# Patient Record
Sex: Female | Born: 1945 | Race: Black or African American | Hispanic: No | Smoking: Former smoker
Health system: Southern US, Community
[De-identification: ages and names within clinical notes are randomized; demographics above are authoritative.]

## PROBLEM LIST (undated history)

## (undated) DIAGNOSIS — K219 Gastro-esophageal reflux disease without esophagitis: Secondary | ICD-10-CM

## (undated) DIAGNOSIS — C569 Malignant neoplasm of unspecified ovary: Secondary | ICD-10-CM

## (undated) DIAGNOSIS — N189 Chronic kidney disease, unspecified: Secondary | ICD-10-CM

## (undated) DIAGNOSIS — R1310 Dysphagia, unspecified: Secondary | ICD-10-CM

## (undated) DIAGNOSIS — I2699 Other pulmonary embolism without acute cor pulmonale: Secondary | ICD-10-CM

## (undated) DIAGNOSIS — I639 Cerebral infarction, unspecified: Secondary | ICD-10-CM

## (undated) DIAGNOSIS — L89309 Pressure ulcer of unspecified buttock, unspecified stage: Secondary | ICD-10-CM

## (undated) DIAGNOSIS — E785 Hyperlipidemia, unspecified: Secondary | ICD-10-CM

## (undated) DIAGNOSIS — I219 Acute myocardial infarction, unspecified: Secondary | ICD-10-CM

## (undated) DIAGNOSIS — E1149 Type 2 diabetes mellitus with other diabetic neurological complication: Secondary | ICD-10-CM

## (undated) DIAGNOSIS — K635 Polyp of colon: Secondary | ICD-10-CM

## (undated) DIAGNOSIS — I509 Heart failure, unspecified: Secondary | ICD-10-CM

## (undated) DIAGNOSIS — F418 Other specified anxiety disorders: Secondary | ICD-10-CM

## (undated) DIAGNOSIS — I69359 Hemiplegia and hemiparesis following cerebral infarction affecting unspecified side: Principal | ICD-10-CM

## (undated) DIAGNOSIS — G609 Hereditary and idiopathic neuropathy, unspecified: Secondary | ICD-10-CM

## (undated) DIAGNOSIS — R609 Edema, unspecified: Secondary | ICD-10-CM

## (undated) DIAGNOSIS — D649 Anemia, unspecified: Secondary | ICD-10-CM

## (undated) DIAGNOSIS — N63 Unspecified lump in unspecified breast: Secondary | ICD-10-CM

## (undated) DIAGNOSIS — R600 Localized edema: Secondary | ICD-10-CM

## (undated) DIAGNOSIS — N259 Disorder resulting from impaired renal tubular function, unspecified: Secondary | ICD-10-CM

## (undated) DIAGNOSIS — I69398 Other sequelae of cerebral infarction: Principal | ICD-10-CM

## (undated) DIAGNOSIS — I428 Other cardiomyopathies: Secondary | ICD-10-CM

## (undated) DIAGNOSIS — I10 Essential (primary) hypertension: Principal | ICD-10-CM

## (undated) DIAGNOSIS — I059 Rheumatic mitral valve disease, unspecified: Secondary | ICD-10-CM

## (undated) DIAGNOSIS — K5790 Diverticulosis of intestine, part unspecified, without perforation or abscess without bleeding: Secondary | ICD-10-CM

## (undated) DIAGNOSIS — D509 Iron deficiency anemia, unspecified: Secondary | ICD-10-CM

## (undated) DIAGNOSIS — G8190 Hemiplegia, unspecified affecting unspecified side: Secondary | ICD-10-CM

## (undated) DIAGNOSIS — Z950 Presence of cardiac pacemaker: Secondary | ICD-10-CM

## (undated) DIAGNOSIS — G629 Polyneuropathy, unspecified: Secondary | ICD-10-CM

## (undated) DIAGNOSIS — I82409 Acute embolism and thrombosis of unspecified deep veins of unspecified lower extremity: Secondary | ICD-10-CM

## (undated) DIAGNOSIS — E119 Type 2 diabetes mellitus without complications: Secondary | ICD-10-CM

## (undated) DIAGNOSIS — E876 Hypokalemia: Secondary | ICD-10-CM

## (undated) HISTORY — DX: Essential (primary) hypertension: I10

## (undated) HISTORY — DX: Hemiplegia, unspecified affecting unspecified side: G81.90

## (undated) HISTORY — DX: Other sequelae of cerebral infarction: I69.398

## (undated) HISTORY — DX: Iron deficiency anemia, unspecified: D50.9

## (undated) HISTORY — PX: PACEMAKER INSERTION: SHX728

## (undated) HISTORY — DX: Unspecified lump in unspecified breast: N63.0

## (undated) HISTORY — DX: Polyneuropathy, unspecified: G62.9

## (undated) HISTORY — DX: Presence of cardiac pacemaker: Z95.0

## (undated) HISTORY — DX: Rheumatic mitral valve disease, unspecified: I05.9

## (undated) HISTORY — DX: Type 2 diabetes mellitus without complications: E11.9

## (undated) HISTORY — DX: Localized edema: R60.0

## (undated) HISTORY — DX: Hereditary and idiopathic neuropathy, unspecified: G60.9

## (undated) HISTORY — DX: Chronic kidney disease, unspecified: N18.9

## (undated) HISTORY — PX: CATARACT EXTRACTION: SUR2

## (undated) HISTORY — DX: Cerebral infarction, unspecified: I63.9

## (undated) HISTORY — DX: Other specified anxiety disorders: F41.8

## (undated) HISTORY — DX: Edema, unspecified: R60.9

## (undated) HISTORY — DX: Gastro-esophageal reflux disease without esophagitis: K21.9

## (undated) HISTORY — DX: Anemia, unspecified: D64.9

## (undated) HISTORY — DX: Dysphagia, unspecified: R13.10

## (undated) HISTORY — DX: Hemiplegia and hemiparesis following cerebral infarction affecting unspecified side: I69.359

## (undated) HISTORY — DX: Acute myocardial infarction, unspecified: I21.9

## (undated) HISTORY — DX: Malignant neoplasm of unspecified ovary: C56.9

## (undated) HISTORY — DX: Disorder resulting from impaired renal tubular function, unspecified: N25.9

## (undated) HISTORY — DX: Type 2 diabetes mellitus with other diabetic neurological complication: E11.49

## (undated) HISTORY — DX: Pressure ulcer of unspecified buttock, unspecified stage: L89.309

## (undated) HISTORY — DX: Hyperlipidemia, unspecified: E78.5

## (undated) HISTORY — DX: Other cardiomyopathies: I42.8

## (undated) HISTORY — DX: Diverticulosis of intestine, part unspecified, without perforation or abscess without bleeding: K57.90

## (undated) HISTORY — DX: Heart failure, unspecified: I50.9

## (undated) HISTORY — DX: Other pulmonary embolism without acute cor pulmonale: I26.99

## (undated) HISTORY — DX: Acute embolism and thrombosis of unspecified deep veins of unspecified lower extremity: I82.409

## (undated) HISTORY — PX: CORONARY ARTERY BYPASS GRAFT: SHX141

## (undated) HISTORY — DX: Polyp of colon: K63.5

## (undated) HISTORY — DX: Hypokalemia: E87.6

---

## 1997-08-17 ENCOUNTER — Encounter: Admission: RE | Admit: 1997-08-17 | Discharge: 1997-08-17 | Payer: Self-pay | Admitting: Internal Medicine

## 1997-09-24 ENCOUNTER — Encounter: Admission: RE | Admit: 1997-09-24 | Discharge: 1997-09-24 | Payer: Self-pay | Admitting: Internal Medicine

## 1998-01-21 ENCOUNTER — Encounter: Admission: RE | Admit: 1998-01-21 | Discharge: 1998-01-21 | Payer: Self-pay | Admitting: Internal Medicine

## 1998-02-21 ENCOUNTER — Ambulatory Visit (HOSPITAL_COMMUNITY): Admission: RE | Admit: 1998-02-21 | Discharge: 1998-02-21 | Payer: Self-pay | Admitting: Family Medicine

## 1998-06-27 ENCOUNTER — Encounter: Admission: RE | Admit: 1998-06-27 | Discharge: 1998-06-27 | Payer: Self-pay | Admitting: Internal Medicine

## 1998-11-28 ENCOUNTER — Encounter: Admission: RE | Admit: 1998-11-28 | Discharge: 1998-11-28 | Payer: Self-pay | Admitting: Internal Medicine

## 1998-12-26 ENCOUNTER — Encounter: Admission: RE | Admit: 1998-12-26 | Discharge: 1998-12-26 | Payer: Self-pay | Admitting: Internal Medicine

## 1999-03-13 ENCOUNTER — Encounter: Admission: RE | Admit: 1999-03-13 | Discharge: 1999-03-13 | Payer: Self-pay | Admitting: Hematology and Oncology

## 1999-05-04 ENCOUNTER — Ambulatory Visit (HOSPITAL_COMMUNITY): Admission: RE | Admit: 1999-05-04 | Discharge: 1999-05-04 | Payer: Self-pay | Admitting: *Deleted

## 1999-05-04 ENCOUNTER — Encounter: Payer: Self-pay | Admitting: *Deleted

## 1999-07-10 ENCOUNTER — Encounter: Admission: RE | Admit: 1999-07-10 | Discharge: 1999-07-10 | Payer: Self-pay | Admitting: Internal Medicine

## 1999-08-28 ENCOUNTER — Encounter: Admission: RE | Admit: 1999-08-28 | Discharge: 1999-08-28 | Payer: Self-pay | Admitting: Internal Medicine

## 2000-01-03 ENCOUNTER — Encounter: Admission: RE | Admit: 2000-01-03 | Discharge: 2000-01-03 | Payer: Self-pay | Admitting: Internal Medicine

## 2000-04-10 ENCOUNTER — Encounter: Admission: RE | Admit: 2000-04-10 | Discharge: 2000-04-10 | Payer: Self-pay | Admitting: Internal Medicine

## 2000-05-31 ENCOUNTER — Emergency Department (HOSPITAL_COMMUNITY): Admission: EM | Admit: 2000-05-31 | Discharge: 2000-05-31 | Payer: Self-pay | Admitting: Emergency Medicine

## 2000-06-18 ENCOUNTER — Emergency Department (HOSPITAL_COMMUNITY): Admission: EM | Admit: 2000-06-18 | Discharge: 2000-06-18 | Payer: Self-pay | Admitting: Emergency Medicine

## 2000-10-16 ENCOUNTER — Encounter: Admission: RE | Admit: 2000-10-16 | Discharge: 2000-10-16 | Payer: Self-pay | Admitting: Internal Medicine

## 2001-02-12 ENCOUNTER — Encounter: Admission: RE | Admit: 2001-02-12 | Discharge: 2001-02-12 | Payer: Self-pay | Admitting: Internal Medicine

## 2001-03-11 ENCOUNTER — Ambulatory Visit (HOSPITAL_COMMUNITY): Admission: RE | Admit: 2001-03-11 | Discharge: 2001-03-11 | Payer: Self-pay | Admitting: *Deleted

## 2001-03-18 ENCOUNTER — Encounter: Admission: RE | Admit: 2001-03-18 | Discharge: 2001-03-18 | Payer: Self-pay | Admitting: Obstetrics

## 2001-03-18 ENCOUNTER — Encounter: Payer: Self-pay | Admitting: Obstetrics

## 2001-06-25 ENCOUNTER — Encounter: Admission: RE | Admit: 2001-06-25 | Discharge: 2001-06-25 | Payer: Self-pay | Admitting: Internal Medicine

## 2001-07-08 ENCOUNTER — Encounter: Admission: RE | Admit: 2001-07-08 | Discharge: 2001-07-08 | Payer: Self-pay | Admitting: Internal Medicine

## 2001-07-09 ENCOUNTER — Ambulatory Visit (HOSPITAL_COMMUNITY): Admission: RE | Admit: 2001-07-09 | Discharge: 2001-07-09 | Payer: Self-pay | Admitting: *Deleted

## 2001-07-17 ENCOUNTER — Encounter: Payer: Self-pay | Admitting: Cardiology

## 2001-07-17 ENCOUNTER — Ambulatory Visit (HOSPITAL_COMMUNITY): Admission: RE | Admit: 2001-07-17 | Discharge: 2001-07-17 | Payer: Self-pay | Admitting: Cardiology

## 2001-10-07 ENCOUNTER — Encounter: Admission: RE | Admit: 2001-10-07 | Discharge: 2001-10-07 | Payer: Self-pay | Admitting: Internal Medicine

## 2002-01-06 ENCOUNTER — Encounter: Admission: RE | Admit: 2002-01-06 | Discharge: 2002-01-06 | Payer: Self-pay | Admitting: Internal Medicine

## 2002-02-16 ENCOUNTER — Encounter: Admission: RE | Admit: 2002-02-16 | Discharge: 2002-02-16 | Payer: Self-pay | Admitting: Internal Medicine

## 2002-04-14 ENCOUNTER — Encounter: Admission: RE | Admit: 2002-04-14 | Discharge: 2002-04-14 | Payer: Self-pay | Admitting: Internal Medicine

## 2002-06-09 ENCOUNTER — Encounter: Admission: RE | Admit: 2002-06-09 | Discharge: 2002-06-09 | Payer: Self-pay | Admitting: Internal Medicine

## 2003-02-02 ENCOUNTER — Encounter: Admission: RE | Admit: 2003-02-02 | Discharge: 2003-02-02 | Payer: Self-pay | Admitting: Internal Medicine

## 2003-02-16 ENCOUNTER — Encounter: Admission: RE | Admit: 2003-02-16 | Discharge: 2003-02-16 | Payer: Self-pay | Admitting: Internal Medicine

## 2003-04-20 ENCOUNTER — Encounter: Admission: RE | Admit: 2003-04-20 | Discharge: 2003-04-20 | Payer: Self-pay | Admitting: Internal Medicine

## 2003-05-05 ENCOUNTER — Ambulatory Visit (HOSPITAL_COMMUNITY): Admission: RE | Admit: 2003-05-05 | Discharge: 2003-05-05 | Payer: Self-pay | Admitting: Internal Medicine

## 2003-06-03 ENCOUNTER — Encounter: Admission: RE | Admit: 2003-06-03 | Discharge: 2003-06-03 | Payer: Self-pay | Admitting: Internal Medicine

## 2003-08-09 ENCOUNTER — Encounter: Admission: RE | Admit: 2003-08-09 | Discharge: 2003-08-09 | Payer: Self-pay | Admitting: Internal Medicine

## 2003-08-19 ENCOUNTER — Encounter: Admission: RE | Admit: 2003-08-19 | Discharge: 2003-08-19 | Payer: Self-pay | Admitting: Internal Medicine

## 2003-08-31 ENCOUNTER — Encounter: Admission: RE | Admit: 2003-08-31 | Discharge: 2003-11-29 | Payer: Self-pay | Admitting: Internal Medicine

## 2003-09-27 ENCOUNTER — Encounter: Admission: RE | Admit: 2003-09-27 | Discharge: 2003-09-27 | Payer: Self-pay | Admitting: Internal Medicine

## 2003-10-04 ENCOUNTER — Encounter: Admission: RE | Admit: 2003-10-04 | Discharge: 2003-10-04 | Payer: Self-pay | Admitting: Internal Medicine

## 2003-12-04 ENCOUNTER — Inpatient Hospital Stay (HOSPITAL_COMMUNITY): Admission: EM | Admit: 2003-12-04 | Discharge: 2003-12-06 | Payer: Self-pay | Admitting: Emergency Medicine

## 2004-01-05 ENCOUNTER — Encounter
Admission: RE | Admit: 2004-01-05 | Discharge: 2004-01-05 | Payer: Self-pay | Admitting: Thoracic Surgery (Cardiothoracic Vascular Surgery)

## 2004-01-05 ENCOUNTER — Ambulatory Visit (HOSPITAL_COMMUNITY)
Admission: RE | Admit: 2004-01-05 | Discharge: 2004-01-05 | Payer: Self-pay | Admitting: Thoracic Surgery (Cardiothoracic Vascular Surgery)

## 2004-01-05 ENCOUNTER — Encounter: Admission: RE | Admit: 2004-01-05 | Discharge: 2004-01-05 | Payer: Self-pay | Admitting: Internal Medicine

## 2004-02-03 ENCOUNTER — Ambulatory Visit (HOSPITAL_COMMUNITY): Admission: RE | Admit: 2004-02-03 | Discharge: 2004-02-03 | Payer: Self-pay | Admitting: Cardiology

## 2004-02-03 ENCOUNTER — Encounter (INDEPENDENT_AMBULATORY_CARE_PROVIDER_SITE_OTHER): Payer: Self-pay | Admitting: Cardiology

## 2004-03-20 ENCOUNTER — Ambulatory Visit: Payer: Self-pay | Admitting: Internal Medicine

## 2004-03-21 ENCOUNTER — Inpatient Hospital Stay (HOSPITAL_COMMUNITY)
Admission: RE | Admit: 2004-03-21 | Discharge: 2004-04-04 | Payer: Self-pay | Admitting: Thoracic Surgery (Cardiothoracic Vascular Surgery)

## 2004-04-24 ENCOUNTER — Encounter
Admission: RE | Admit: 2004-04-24 | Discharge: 2004-04-24 | Payer: Self-pay | Admitting: Thoracic Surgery (Cardiothoracic Vascular Surgery)

## 2004-05-31 ENCOUNTER — Ambulatory Visit: Payer: Self-pay | Admitting: Internal Medicine

## 2004-06-12 ENCOUNTER — Encounter (HOSPITAL_COMMUNITY): Admission: RE | Admit: 2004-06-12 | Discharge: 2004-09-10 | Payer: Self-pay | Admitting: Cardiology

## 2004-09-11 ENCOUNTER — Encounter (HOSPITAL_COMMUNITY): Admission: RE | Admit: 2004-09-11 | Discharge: 2004-10-09 | Payer: Self-pay | Admitting: Cardiology

## 2004-09-11 ENCOUNTER — Ambulatory Visit: Payer: Self-pay | Admitting: Internal Medicine

## 2004-09-25 ENCOUNTER — Ambulatory Visit: Payer: Self-pay | Admitting: Internal Medicine

## 2004-10-05 ENCOUNTER — Ambulatory Visit: Payer: Self-pay | Admitting: Internal Medicine

## 2004-10-10 ENCOUNTER — Ambulatory Visit: Payer: Self-pay | Admitting: Internal Medicine

## 2004-10-10 ENCOUNTER — Encounter (HOSPITAL_COMMUNITY): Admission: RE | Admit: 2004-10-10 | Discharge: 2005-01-08 | Payer: Self-pay | Admitting: Cardiology

## 2005-01-11 ENCOUNTER — Ambulatory Visit: Payer: Self-pay | Admitting: Internal Medicine

## 2005-01-12 ENCOUNTER — Ambulatory Visit: Payer: Self-pay | Admitting: Internal Medicine

## 2005-01-23 ENCOUNTER — Ambulatory Visit (HOSPITAL_COMMUNITY): Admission: RE | Admit: 2005-01-23 | Discharge: 2005-01-23 | Payer: Self-pay | Admitting: Internal Medicine

## 2005-01-26 ENCOUNTER — Ambulatory Visit: Payer: Self-pay | Admitting: Internal Medicine

## 2005-04-03 ENCOUNTER — Ambulatory Visit: Payer: Self-pay | Admitting: Internal Medicine

## 2005-04-26 ENCOUNTER — Ambulatory Visit: Payer: Self-pay | Admitting: Internal Medicine

## 2005-06-07 ENCOUNTER — Ambulatory Visit: Payer: Self-pay | Admitting: Internal Medicine

## 2005-06-11 ENCOUNTER — Ambulatory Visit: Payer: Self-pay | Admitting: Internal Medicine

## 2005-06-21 ENCOUNTER — Ambulatory Visit (HOSPITAL_COMMUNITY): Admission: RE | Admit: 2005-06-21 | Discharge: 2005-06-21 | Payer: Self-pay | Admitting: Internal Medicine

## 2005-06-28 ENCOUNTER — Encounter (HOSPITAL_COMMUNITY): Admission: RE | Admit: 2005-06-28 | Discharge: 2005-09-26 | Payer: Self-pay | Admitting: Cardiology

## 2005-08-27 ENCOUNTER — Ambulatory Visit: Payer: Self-pay | Admitting: Hospitalist

## 2005-09-11 ENCOUNTER — Ambulatory Visit: Payer: Self-pay | Admitting: Internal Medicine

## 2005-09-27 ENCOUNTER — Encounter (HOSPITAL_COMMUNITY): Admission: RE | Admit: 2005-09-27 | Discharge: 2005-12-26 | Payer: Self-pay | Admitting: Cardiology

## 2005-10-23 ENCOUNTER — Ambulatory Visit: Payer: Self-pay | Admitting: Internal Medicine

## 2005-10-30 ENCOUNTER — Ambulatory Visit: Payer: Self-pay | Admitting: Internal Medicine

## 2006-01-14 ENCOUNTER — Ambulatory Visit: Payer: Self-pay | Admitting: Internal Medicine

## 2006-02-05 ENCOUNTER — Ambulatory Visit: Payer: Self-pay | Admitting: Internal Medicine

## 2006-02-14 DIAGNOSIS — I509 Heart failure, unspecified: Secondary | ICD-10-CM

## 2006-02-14 DIAGNOSIS — I10 Essential (primary) hypertension: Secondary | ICD-10-CM

## 2006-02-14 DIAGNOSIS — E119 Type 2 diabetes mellitus without complications: Secondary | ICD-10-CM

## 2006-02-14 DIAGNOSIS — I5032 Chronic diastolic (congestive) heart failure: Secondary | ICD-10-CM | POA: Insufficient documentation

## 2006-02-14 HISTORY — DX: Essential (primary) hypertension: I10

## 2006-02-14 HISTORY — DX: Heart failure, unspecified: I50.9

## 2006-05-23 DIAGNOSIS — E1122 Type 2 diabetes mellitus with diabetic chronic kidney disease: Secondary | ICD-10-CM

## 2006-05-23 DIAGNOSIS — I059 Rheumatic mitral valve disease, unspecified: Secondary | ICD-10-CM | POA: Insufficient documentation

## 2006-05-23 DIAGNOSIS — N183 Chronic kidney disease, stage 3 (moderate): Secondary | ICD-10-CM

## 2006-05-23 DIAGNOSIS — K5900 Constipation, unspecified: Secondary | ICD-10-CM | POA: Insufficient documentation

## 2006-05-23 DIAGNOSIS — Z950 Presence of cardiac pacemaker: Secondary | ICD-10-CM

## 2006-05-23 DIAGNOSIS — I428 Other cardiomyopathies: Secondary | ICD-10-CM | POA: Insufficient documentation

## 2006-05-23 DIAGNOSIS — I129 Hypertensive chronic kidney disease with stage 1 through stage 4 chronic kidney disease, or unspecified chronic kidney disease: Secondary | ICD-10-CM

## 2006-05-23 DIAGNOSIS — E785 Hyperlipidemia, unspecified: Secondary | ICD-10-CM

## 2006-05-23 DIAGNOSIS — N259 Disorder resulting from impaired renal tubular function, unspecified: Secondary | ICD-10-CM

## 2006-05-23 DIAGNOSIS — F411 Generalized anxiety disorder: Secondary | ICD-10-CM

## 2006-05-23 HISTORY — DX: Disorder resulting from impaired renal tubular function, unspecified: N25.9

## 2006-05-23 HISTORY — DX: Presence of cardiac pacemaker: Z95.0

## 2006-05-23 HISTORY — DX: Other cardiomyopathies: I42.8

## 2006-05-23 HISTORY — DX: Rheumatic mitral valve disease, unspecified: I05.9

## 2006-07-04 ENCOUNTER — Encounter (INDEPENDENT_AMBULATORY_CARE_PROVIDER_SITE_OTHER): Payer: Self-pay | Admitting: Unknown Physician Specialty

## 2006-07-04 ENCOUNTER — Ambulatory Visit: Payer: Self-pay | Admitting: Internal Medicine

## 2006-07-12 ENCOUNTER — Ambulatory Visit: Payer: Self-pay | Admitting: Internal Medicine

## 2006-07-12 ENCOUNTER — Encounter (INDEPENDENT_AMBULATORY_CARE_PROVIDER_SITE_OTHER): Payer: Self-pay | Admitting: Internal Medicine

## 2006-07-12 LAB — CONVERTED CEMR LAB: Blood Glucose, Fingerstick: 247

## 2006-07-19 LAB — CONVERTED CEMR LAB
CO2: 26 meq/L (ref 19–32)
Chloride: 102 meq/L (ref 96–112)
Glucose, Bld: 203 mg/dL — ABNORMAL HIGH (ref 70–99)
Sodium: 141 meq/L (ref 135–145)

## 2006-11-05 ENCOUNTER — Telehealth: Payer: Self-pay | Admitting: *Deleted

## 2006-11-12 ENCOUNTER — Emergency Department (HOSPITAL_COMMUNITY): Admission: EM | Admit: 2006-11-12 | Discharge: 2006-11-12 | Payer: Self-pay | Admitting: Emergency Medicine

## 2006-11-18 ENCOUNTER — Ambulatory Visit: Payer: Self-pay | Admitting: Hospitalist

## 2006-11-18 ENCOUNTER — Encounter: Payer: Self-pay | Admitting: Internal Medicine

## 2006-11-18 DIAGNOSIS — G609 Hereditary and idiopathic neuropathy, unspecified: Secondary | ICD-10-CM

## 2006-11-18 HISTORY — DX: Hereditary and idiopathic neuropathy, unspecified: G60.9

## 2006-11-18 LAB — CONVERTED CEMR LAB
Albumin: 4 g/dL (ref 3.5–5.2)
CO2: 27 meq/L (ref 19–32)
Calcium: 9.4 mg/dL (ref 8.4–10.5)
Chloride: 103 meq/L (ref 96–112)
Free T4: 1.26 ng/dL (ref 0.89–1.80)
Glucose, Bld: 144 mg/dL — ABNORMAL HIGH (ref 70–99)
Potassium: 4.6 meq/L (ref 3.5–5.3)
Sodium: 144 meq/L (ref 135–145)
TSH: 0.359 microintl units/mL (ref 0.350–5.50)
Total Protein: 6.7 g/dL (ref 6.0–8.3)

## 2006-11-21 ENCOUNTER — Encounter (INDEPENDENT_AMBULATORY_CARE_PROVIDER_SITE_OTHER): Payer: Self-pay | Admitting: Internal Medicine

## 2006-11-26 ENCOUNTER — Ambulatory Visit: Payer: Self-pay | Admitting: Internal Medicine

## 2006-11-26 ENCOUNTER — Encounter: Payer: Self-pay | Admitting: Internal Medicine

## 2006-11-26 DIAGNOSIS — E876 Hypokalemia: Secondary | ICD-10-CM

## 2006-11-26 HISTORY — DX: Hypokalemia: E87.6

## 2006-11-26 LAB — CONVERTED CEMR LAB
BUN: 9 mg/dL (ref 6–23)
CO2: 30 meq/L (ref 19–32)
Calcium: 9.3 mg/dL (ref 8.4–10.5)
Creatinine, Ser: 1.4 mg/dL — ABNORMAL HIGH (ref 0.40–1.20)
Glucose, Bld: 149 mg/dL — ABNORMAL HIGH (ref 70–99)
Sodium: 139 meq/L (ref 135–145)

## 2006-12-13 ENCOUNTER — Telehealth (INDEPENDENT_AMBULATORY_CARE_PROVIDER_SITE_OTHER): Payer: Self-pay | Admitting: Pharmacy Technician

## 2006-12-16 ENCOUNTER — Encounter (INDEPENDENT_AMBULATORY_CARE_PROVIDER_SITE_OTHER): Payer: Self-pay | Admitting: Internal Medicine

## 2006-12-16 ENCOUNTER — Ambulatory Visit: Payer: Self-pay | Admitting: *Deleted

## 2006-12-17 LAB — CONVERTED CEMR LAB
CO2: 28 meq/L (ref 19–32)
Calcium: 9.1 mg/dL (ref 8.4–10.5)
Creatinine, Ser: 1.29 mg/dL — ABNORMAL HIGH (ref 0.40–1.20)

## 2006-12-20 ENCOUNTER — Encounter
Admission: RE | Admit: 2006-12-20 | Discharge: 2007-02-04 | Payer: Self-pay | Admitting: Physical Medicine & Rehabilitation

## 2006-12-20 ENCOUNTER — Ambulatory Visit: Payer: Self-pay | Admitting: Physical Medicine & Rehabilitation

## 2006-12-23 ENCOUNTER — Encounter (INDEPENDENT_AMBULATORY_CARE_PROVIDER_SITE_OTHER): Payer: Self-pay | Admitting: Internal Medicine

## 2006-12-27 ENCOUNTER — Ambulatory Visit (HOSPITAL_COMMUNITY): Admission: RE | Admit: 2006-12-27 | Discharge: 2006-12-27 | Payer: Self-pay | Admitting: Obstetrics and Gynecology

## 2007-01-03 ENCOUNTER — Telehealth (INDEPENDENT_AMBULATORY_CARE_PROVIDER_SITE_OTHER): Payer: Self-pay | Admitting: *Deleted

## 2007-01-09 ENCOUNTER — Telehealth: Payer: Self-pay | Admitting: *Deleted

## 2007-04-08 ENCOUNTER — Ambulatory Visit: Payer: Self-pay | Admitting: Internal Medicine

## 2007-04-08 ENCOUNTER — Encounter (INDEPENDENT_AMBULATORY_CARE_PROVIDER_SITE_OTHER): Payer: Self-pay | Admitting: Internal Medicine

## 2007-04-08 LAB — CONVERTED CEMR LAB: Hgb A1c MFr Bld: 8.1 %

## 2007-04-22 LAB — CONVERTED CEMR LAB
AST: 9 units/L (ref 0–37)
Alkaline Phosphatase: 92 units/L (ref 39–117)
BUN: 14 mg/dL (ref 6–23)
Basophils Relative: 0 % (ref 0–1)
Creatinine, Ser: 1.57 mg/dL — ABNORMAL HIGH (ref 0.40–1.20)
Eosinophils Absolute: 0.2 10*3/uL (ref 0.2–0.7)
MCHC: 31.6 g/dL (ref 30.0–36.0)
MCV: 92.9 fL (ref 78.0–100.0)
Microalb, Ur: 59.6 mg/dL — ABNORMAL HIGH (ref 0.00–1.89)
Monocytes Absolute: 0.6 10*3/uL (ref 0.1–1.0)
Monocytes Relative: 5 % (ref 3–12)
Neutrophils Relative %: 69 % (ref 43–77)
RBC: 4.77 M/uL (ref 3.87–5.11)

## 2007-05-07 ENCOUNTER — Telehealth: Payer: Self-pay | Admitting: *Deleted

## 2007-05-16 ENCOUNTER — Ambulatory Visit: Payer: Self-pay | Admitting: Infectious Disease

## 2007-05-21 ENCOUNTER — Telehealth (INDEPENDENT_AMBULATORY_CARE_PROVIDER_SITE_OTHER): Payer: Self-pay | Admitting: *Deleted

## 2007-06-06 ENCOUNTER — Encounter (INDEPENDENT_AMBULATORY_CARE_PROVIDER_SITE_OTHER): Payer: Self-pay | Admitting: Internal Medicine

## 2007-06-06 ENCOUNTER — Ambulatory Visit: Payer: Self-pay | Admitting: Internal Medicine

## 2007-06-09 ENCOUNTER — Telehealth (INDEPENDENT_AMBULATORY_CARE_PROVIDER_SITE_OTHER): Payer: Self-pay | Admitting: Internal Medicine

## 2007-06-09 LAB — CONVERTED CEMR LAB
HDL: 29 mg/dL — ABNORMAL LOW (ref >39)
LDL Cholesterol: 73 mg/dL (ref 0–99)
Triglycerides: 344 mg/dL — ABNORMAL HIGH (ref <150)
VLDL: 69 mg/dL — ABNORMAL HIGH (ref 0–40)

## 2007-06-16 ENCOUNTER — Telehealth (INDEPENDENT_AMBULATORY_CARE_PROVIDER_SITE_OTHER): Payer: Self-pay | Admitting: Internal Medicine

## 2007-07-09 ENCOUNTER — Encounter (INDEPENDENT_AMBULATORY_CARE_PROVIDER_SITE_OTHER): Payer: Self-pay | Admitting: Internal Medicine

## 2007-12-09 ENCOUNTER — Encounter: Payer: Self-pay | Admitting: Licensed Clinical Social Worker

## 2008-01-23 ENCOUNTER — Encounter (INDEPENDENT_AMBULATORY_CARE_PROVIDER_SITE_OTHER): Payer: Self-pay | Admitting: Internal Medicine

## 2008-02-20 ENCOUNTER — Ambulatory Visit (HOSPITAL_COMMUNITY): Admission: RE | Admit: 2008-02-20 | Discharge: 2008-02-20 | Payer: Self-pay | Admitting: Internal Medicine

## 2008-04-05 ENCOUNTER — Ambulatory Visit: Payer: Self-pay | Admitting: Internal Medicine

## 2008-04-05 LAB — CONVERTED CEMR LAB
Blood Glucose, AC Bkfst: 290 mg/dL
Hgb A1c MFr Bld: 10.4 %

## 2008-04-11 LAB — CONVERTED CEMR LAB
AST: 11 units/L (ref 0–37)
BUN: 20 mg/dL (ref 6–23)
Basophils Relative: 0 % (ref 0–1)
Calcium: 9.1 mg/dL (ref 8.4–10.5)
Chloride: 106 meq/L (ref 96–112)
Creatinine, Ser: 1.56 mg/dL — ABNORMAL HIGH (ref 0.40–1.20)
Eosinophils Absolute: 0.3 10*3/uL (ref 0.0–0.7)
Glucose, Bld: 288 mg/dL — ABNORMAL HIGH (ref 70–99)
HDL: 33 mg/dL — ABNORMAL LOW (ref >39)
Hemoglobin: 13.1 g/dL (ref 12.0–15.0)
MCHC: 30.7 g/dL (ref 30.0–36.0)
MCV: 92 fL (ref 78.0–100.0)
Monocytes Absolute: 0.6 10*3/uL (ref 0.1–1.0)
Monocytes Relative: 5 % (ref 3–12)
Neutro Abs: 7.9 10*3/uL — ABNORMAL HIGH (ref 1.7–7.7)
RBC: 4.64 M/uL (ref 3.87–5.11)
TSH: 0.887 microintl units/mL (ref 0.350–4.50)
Total CHOL/HDL Ratio: 5.7
Triglycerides: 215 mg/dL — ABNORMAL HIGH (ref <150)

## 2008-04-19 ENCOUNTER — Ambulatory Visit: Payer: Self-pay | Admitting: Internal Medicine

## 2008-04-19 ENCOUNTER — Encounter: Payer: Self-pay | Admitting: Licensed Clinical Social Worker

## 2008-04-19 DIAGNOSIS — F17200 Nicotine dependence, unspecified, uncomplicated: Secondary | ICD-10-CM

## 2008-05-04 ENCOUNTER — Ambulatory Visit: Payer: Self-pay | Admitting: Internal Medicine

## 2008-05-04 ENCOUNTER — Encounter (INDEPENDENT_AMBULATORY_CARE_PROVIDER_SITE_OTHER): Payer: Self-pay | Admitting: Internal Medicine

## 2008-05-07 DIAGNOSIS — I639 Cerebral infarction, unspecified: Secondary | ICD-10-CM

## 2008-05-07 HISTORY — DX: Cerebral infarction, unspecified: I63.9

## 2008-05-20 ENCOUNTER — Encounter: Payer: Self-pay | Admitting: Internal Medicine

## 2008-05-20 ENCOUNTER — Ambulatory Visit: Payer: Self-pay | Admitting: Internal Medicine

## 2008-05-25 ENCOUNTER — Encounter: Payer: Self-pay | Admitting: Internal Medicine

## 2008-06-03 ENCOUNTER — Ambulatory Visit: Payer: Self-pay | Admitting: Internal Medicine

## 2008-07-05 ENCOUNTER — Encounter: Payer: Self-pay | Admitting: Internal Medicine

## 2008-07-05 ENCOUNTER — Ambulatory Visit: Payer: Self-pay | Admitting: Internal Medicine

## 2008-07-06 DIAGNOSIS — D72829 Elevated white blood cell count, unspecified: Secondary | ICD-10-CM | POA: Insufficient documentation

## 2008-07-06 LAB — CONVERTED CEMR LAB
Albumin: 3.9 g/dL (ref 3.5–5.2)
BUN: 15 mg/dL (ref 6–23)
Band Neutrophils: 0 % (ref 0–10)
CO2: 22 meq/L (ref 19–32)
Calcium: 9.4 mg/dL (ref 8.4–10.5)
Chloride: 108 meq/L (ref 96–112)
Creatinine, Ser: 1.44 mg/dL — ABNORMAL HIGH (ref 0.40–1.20)
Eosinophils Absolute: 0.3 10*3/uL (ref 0.0–0.7)
Eosinophils Relative: 3 % (ref 0–5)
HCT: 39.3 % (ref 36.0–46.0)
Hemoglobin: 12.3 g/dL (ref 12.0–15.0)
Lymphocytes Relative: 23 % (ref 12–46)
Lymphs Abs: 2.6 10*3/uL (ref 0.7–4.0)
MCHC: 31.3 g/dL (ref 30.0–36.0)
MCV: 89.9 fL (ref 78.0–100.0)
Monocytes Absolute: 0.7 10*3/uL (ref 0.1–1.0)
Monocytes Relative: 6 % (ref 3–12)
Potassium: 4.7 meq/L (ref 3.5–5.3)
RBC: 4.37 M/uL (ref 3.87–5.11)
TSH: 0.297 microintl units/mL — ABNORMAL LOW (ref 0.350–4.50)

## 2008-07-28 ENCOUNTER — Ambulatory Visit: Payer: Self-pay | Admitting: *Deleted

## 2008-07-28 LAB — CONVERTED CEMR LAB: Blood Glucose, Home Monitor: 4 mg/dL

## 2008-09-16 ENCOUNTER — Encounter (INDEPENDENT_AMBULATORY_CARE_PROVIDER_SITE_OTHER): Payer: Self-pay | Admitting: Internal Medicine

## 2008-11-06 ENCOUNTER — Ambulatory Visit: Payer: Self-pay | Admitting: Internal Medicine

## 2008-11-06 ENCOUNTER — Inpatient Hospital Stay (HOSPITAL_COMMUNITY): Admission: EM | Admit: 2008-11-06 | Discharge: 2008-11-12 | Payer: Self-pay | Admitting: Emergency Medicine

## 2008-11-06 ENCOUNTER — Encounter: Payer: Self-pay | Admitting: Internal Medicine

## 2008-11-08 ENCOUNTER — Encounter: Payer: Self-pay | Admitting: *Deleted

## 2008-11-08 LAB — CONVERTED CEMR LAB
Cholesterol: 178 mg/dL
LDL Cholesterol: 117 mg/dL
Triglycerides: 161 mg/dL

## 2008-11-09 ENCOUNTER — Encounter (INDEPENDENT_AMBULATORY_CARE_PROVIDER_SITE_OTHER): Payer: Self-pay | Admitting: Internal Medicine

## 2008-11-10 ENCOUNTER — Ambulatory Visit: Payer: Self-pay | Admitting: Physical Medicine & Rehabilitation

## 2008-11-12 ENCOUNTER — Encounter (INDEPENDENT_AMBULATORY_CARE_PROVIDER_SITE_OTHER): Payer: Self-pay | Admitting: Internal Medicine

## 2008-11-12 ENCOUNTER — Inpatient Hospital Stay (HOSPITAL_COMMUNITY)
Admission: RE | Admit: 2008-11-12 | Discharge: 2008-12-15 | Payer: Self-pay | Admitting: Physical Medicine & Rehabilitation

## 2008-11-12 DIAGNOSIS — I69959 Hemiplegia and hemiparesis following unspecified cerebrovascular disease affecting unspecified side: Secondary | ICD-10-CM | POA: Insufficient documentation

## 2008-11-13 ENCOUNTER — Ambulatory Visit: Payer: Self-pay | Admitting: Physical Medicine & Rehabilitation

## 2008-12-14 ENCOUNTER — Ambulatory Visit: Payer: Self-pay | Admitting: Physical Medicine & Rehabilitation

## 2008-12-24 ENCOUNTER — Encounter (INDEPENDENT_AMBULATORY_CARE_PROVIDER_SITE_OTHER): Payer: Self-pay | Admitting: Internal Medicine

## 2008-12-24 ENCOUNTER — Ambulatory Visit: Payer: Self-pay | Admitting: Infectious Disease

## 2008-12-24 ENCOUNTER — Inpatient Hospital Stay (HOSPITAL_COMMUNITY): Admission: EM | Admit: 2008-12-24 | Discharge: 2008-12-30 | Payer: Self-pay | Admitting: Emergency Medicine

## 2008-12-30 ENCOUNTER — Encounter (INDEPENDENT_AMBULATORY_CARE_PROVIDER_SITE_OTHER): Payer: Self-pay | Admitting: Internal Medicine

## 2008-12-31 ENCOUNTER — Ambulatory Visit: Payer: Self-pay | Admitting: Internal Medicine

## 2008-12-31 ENCOUNTER — Encounter (INDEPENDENT_AMBULATORY_CARE_PROVIDER_SITE_OTHER): Payer: Self-pay | Admitting: Internal Medicine

## 2008-12-31 DIAGNOSIS — I82409 Acute embolism and thrombosis of unspecified deep veins of unspecified lower extremity: Secondary | ICD-10-CM

## 2008-12-31 DIAGNOSIS — I2699 Other pulmonary embolism without acute cor pulmonale: Secondary | ICD-10-CM

## 2008-12-31 DIAGNOSIS — N390 Urinary tract infection, site not specified: Secondary | ICD-10-CM

## 2008-12-31 HISTORY — DX: Other pulmonary embolism without acute cor pulmonale: I26.99

## 2008-12-31 HISTORY — DX: Acute embolism and thrombosis of unspecified deep veins of unspecified lower extremity: I82.409

## 2008-12-31 LAB — CONVERTED CEMR LAB
CO2: 27 meq/L (ref 19–32)
Calcium: 9.3 mg/dL (ref 8.4–10.5)
Glucose, Bld: 159 mg/dL — ABNORMAL HIGH (ref 70–99)
Potassium: 3.9 meq/L (ref 3.5–5.3)
Sodium: 143 meq/L (ref 135–145)

## 2009-01-05 ENCOUNTER — Ambulatory Visit: Payer: Self-pay | Admitting: Infectious Disease

## 2009-01-06 ENCOUNTER — Telehealth (INDEPENDENT_AMBULATORY_CARE_PROVIDER_SITE_OTHER): Payer: Self-pay | Admitting: Pharmacist

## 2009-01-06 ENCOUNTER — Encounter: Payer: Self-pay | Admitting: *Deleted

## 2009-01-17 ENCOUNTER — Telehealth (INDEPENDENT_AMBULATORY_CARE_PROVIDER_SITE_OTHER): Payer: Self-pay | Admitting: Pharmacist

## 2009-01-18 ENCOUNTER — Other Ambulatory Visit: Payer: Self-pay | Admitting: Emergency Medicine

## 2009-01-18 ENCOUNTER — Ambulatory Visit: Payer: Self-pay | Admitting: Internal Medicine

## 2009-01-18 ENCOUNTER — Inpatient Hospital Stay (HOSPITAL_COMMUNITY): Admission: EM | Admit: 2009-01-18 | Discharge: 2009-01-25 | Payer: Self-pay | Admitting: Internal Medicine

## 2009-01-18 ENCOUNTER — Encounter: Payer: Self-pay | Admitting: Internal Medicine

## 2009-01-24 ENCOUNTER — Other Ambulatory Visit: Payer: Self-pay | Admitting: Physical Medicine & Rehabilitation

## 2009-01-25 ENCOUNTER — Encounter (INDEPENDENT_AMBULATORY_CARE_PROVIDER_SITE_OTHER): Payer: Self-pay | Admitting: Internal Medicine

## 2009-01-25 ENCOUNTER — Other Ambulatory Visit: Payer: Self-pay | Admitting: Physical Medicine & Rehabilitation

## 2009-01-26 ENCOUNTER — Telehealth: Payer: Self-pay | Admitting: *Deleted

## 2009-01-28 ENCOUNTER — Ambulatory Visit: Payer: Self-pay | Admitting: Infectious Diseases

## 2009-01-28 LAB — CONVERTED CEMR LAB

## 2009-02-09 ENCOUNTER — Encounter (INDEPENDENT_AMBULATORY_CARE_PROVIDER_SITE_OTHER): Payer: Self-pay | Admitting: Internal Medicine

## 2009-02-14 ENCOUNTER — Ambulatory Visit: Payer: Self-pay | Admitting: Internal Medicine

## 2009-02-14 DIAGNOSIS — L89309 Pressure ulcer of unspecified buttock, unspecified stage: Secondary | ICD-10-CM | POA: Insufficient documentation

## 2009-02-14 HISTORY — DX: Pressure ulcer of unspecified buttock, unspecified stage: L89.309

## 2009-02-14 LAB — CONVERTED CEMR LAB
Blood Glucose, Fingerstick: 130
Calcium: 9.5 mg/dL (ref 8.4–10.5)
Glucose, Bld: 134 mg/dL — ABNORMAL HIGH (ref 70–99)
Potassium: 4.4 meq/L (ref 3.5–5.3)
Sodium: 141 meq/L (ref 135–145)

## 2009-02-15 ENCOUNTER — Telehealth: Payer: Self-pay | Admitting: *Deleted

## 2009-02-22 ENCOUNTER — Inpatient Hospital Stay (HOSPITAL_COMMUNITY): Admission: EM | Admit: 2009-02-22 | Discharge: 2009-02-26 | Payer: Self-pay | Admitting: Emergency Medicine

## 2009-02-22 ENCOUNTER — Encounter (INDEPENDENT_AMBULATORY_CARE_PROVIDER_SITE_OTHER): Payer: Self-pay | Admitting: Dermatology

## 2009-02-22 ENCOUNTER — Ambulatory Visit: Payer: Self-pay | Admitting: Infectious Diseases

## 2009-02-25 ENCOUNTER — Encounter: Payer: Self-pay | Admitting: Internal Medicine

## 2009-03-14 ENCOUNTER — Ambulatory Visit: Payer: Self-pay | Admitting: Internal Medicine

## 2009-03-15 LAB — CONVERTED CEMR LAB
BUN: 5 mg/dL — ABNORMAL LOW (ref 6–23)
Chloride: 102 meq/L (ref 96–112)
Potassium: 4.3 meq/L (ref 3.5–5.3)
Prothrombin Time: 13.7 s (ref 11.6–15.2)
Sodium: 138 meq/L (ref 135–145)

## 2010-01-31 IMAGING — CT CT HEAD W/O CM
1 of 2 series · 13 of 30 positions shown, 17 images · non-contrast
Comparison: 11/07/2008

CLINICAL DATA: The hemiparesis.  Worsening left side weakness.

CT HEAD WITHOUT CONTRAST
TECHNIQUE: Contiguous axial images were obtained from the base of
the skull through the vertex without contrast.

[Series 2: brain · axial · 0.47mm/px · z∈[+138,+263]mm · 13 of 28 slices shown, 17 images]
[im 2/28  brain]
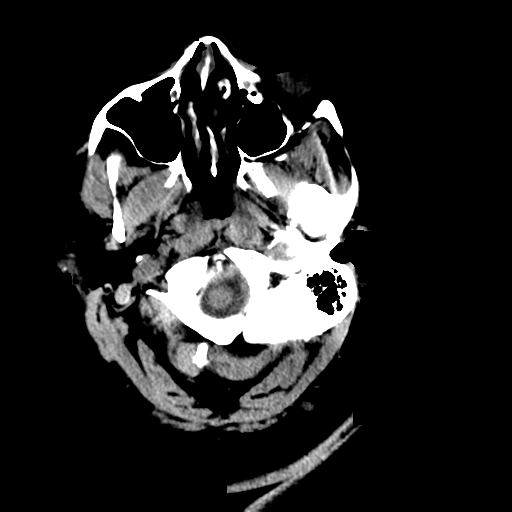
[im 2/28  bone]
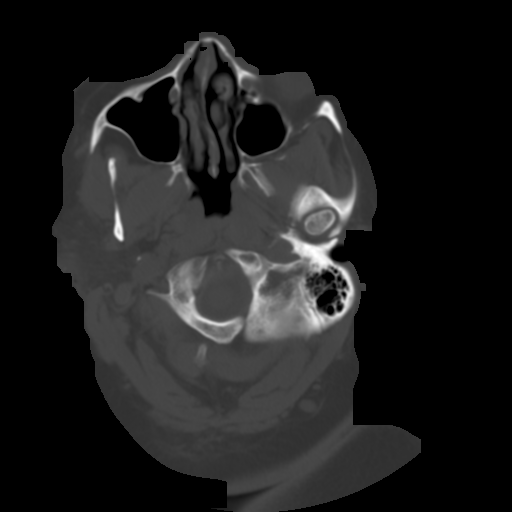
[im 4/28  brain]
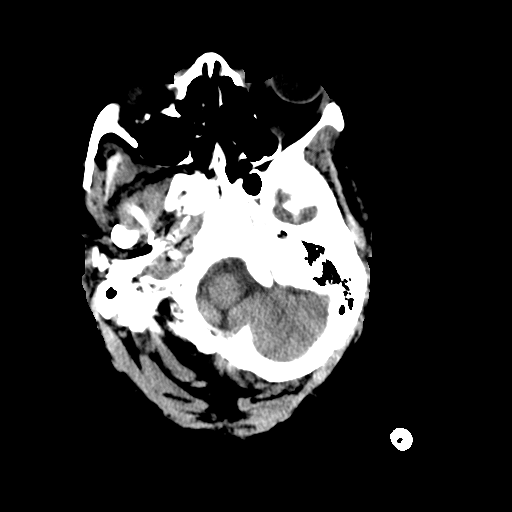
[im 6/28  brain]
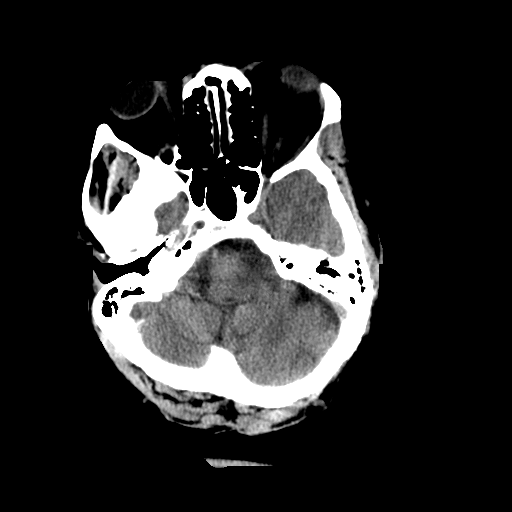
[im 8/28  brain]
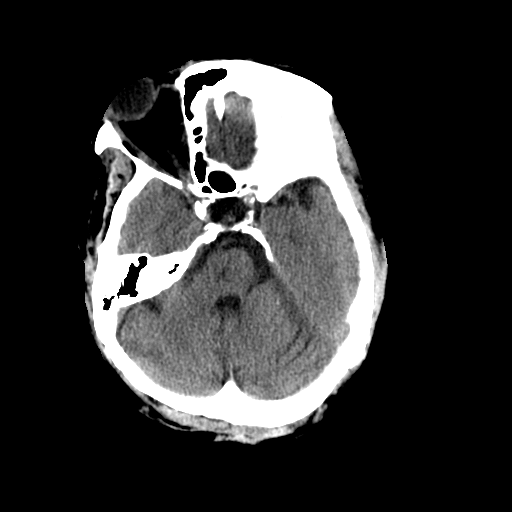
[im 10/28  brain]
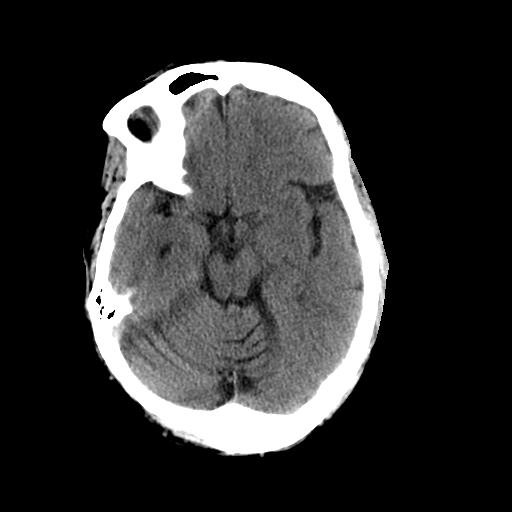
[im 10/28  bone]
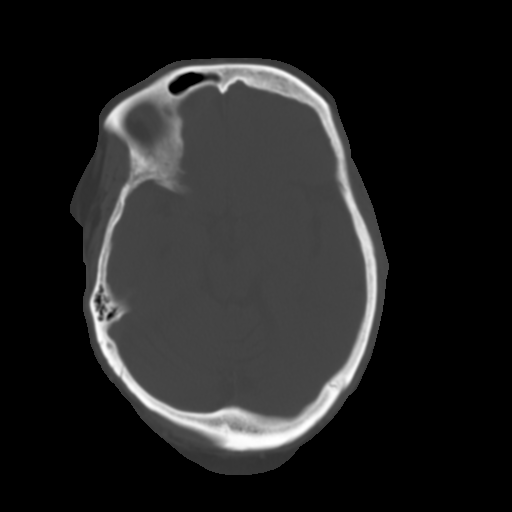
[im 12/28  brain]
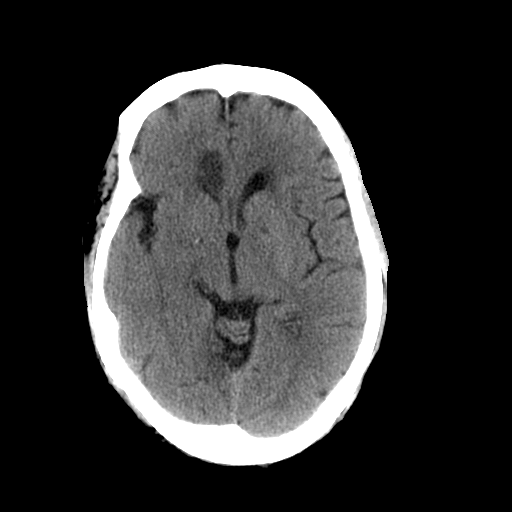
[im 14/28  brain]
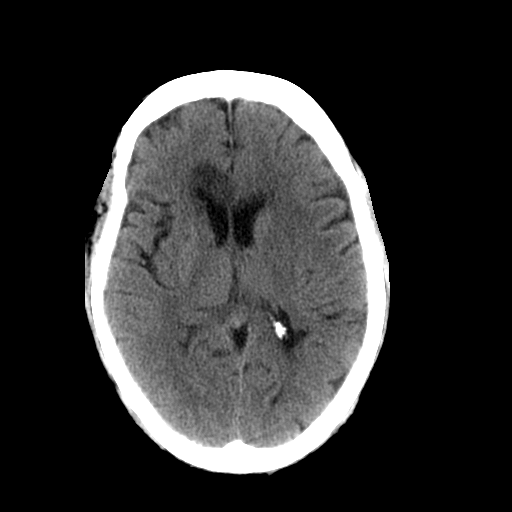
[im 16/28  brain]
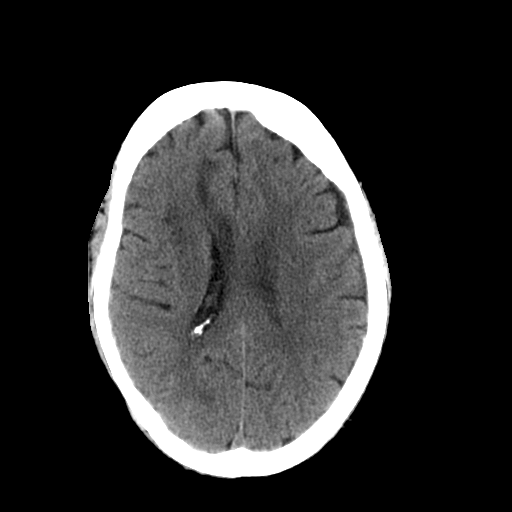
[im 18/28  brain]
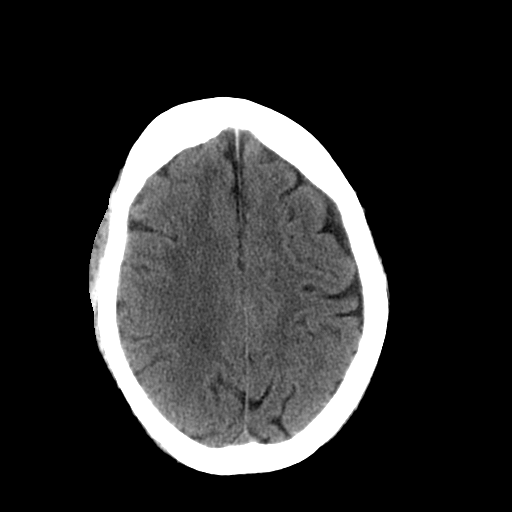
[im 18/28  bone]
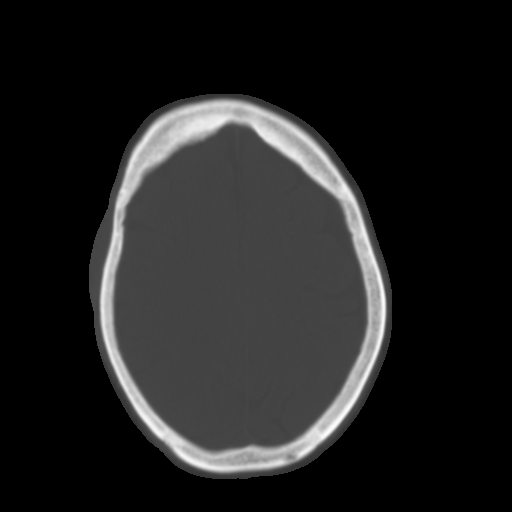
[im 20/28  brain]
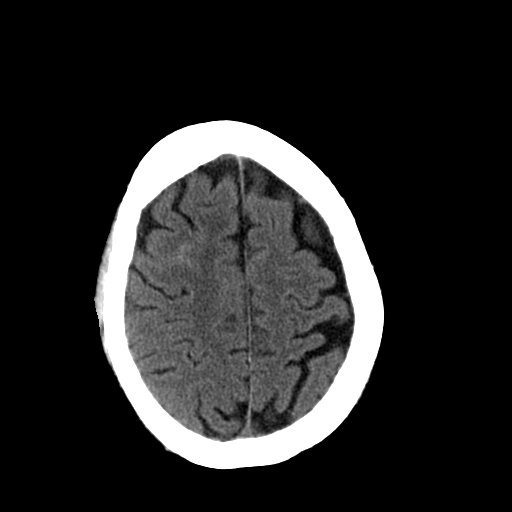
[im 22/28  brain]
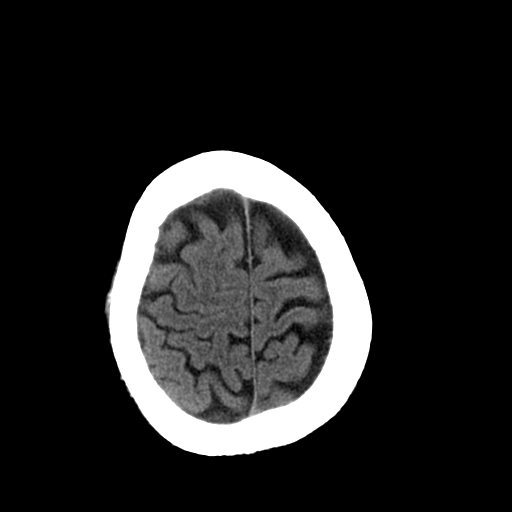
[im 24/28  brain]
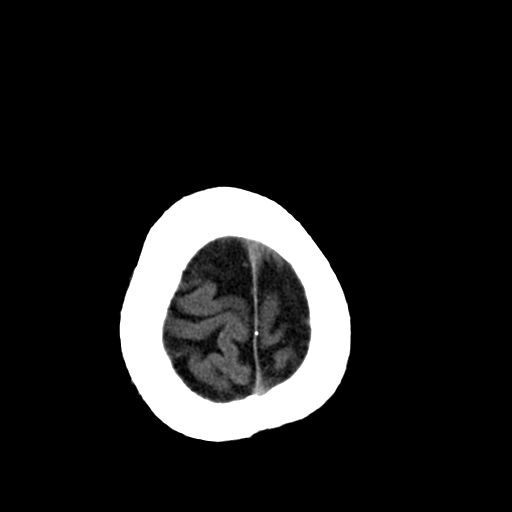
[im 26/28  brain]
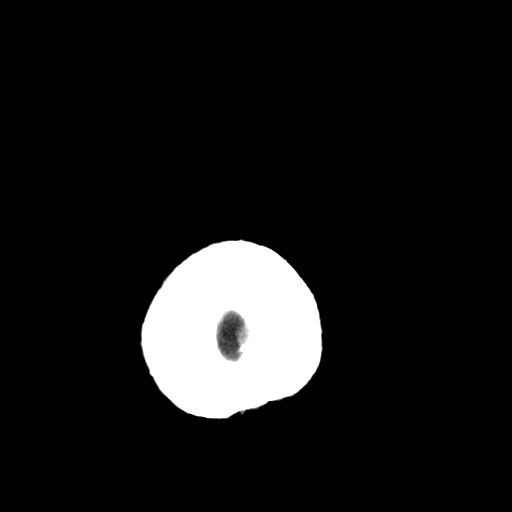
[im 26/28  bone]
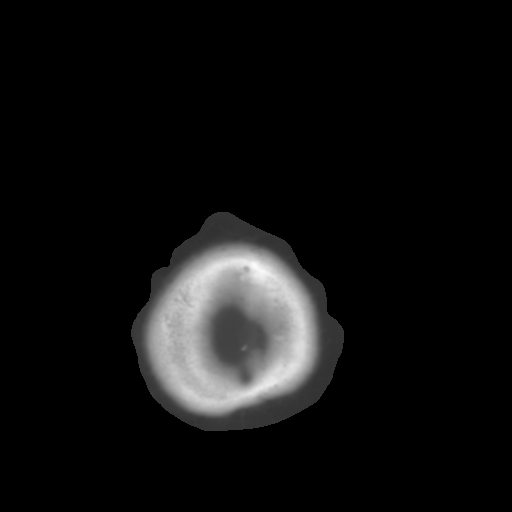

[13 of 30 positions shown; findings below may reference images not displayed]

FINDINGS: There is continued evolution and potentially slight
increase in size of an area of hypoattenuation involving the right
side the corpus callosum extending into the subcortical white
matter of the right frontal lobe.  This appears be within a right
ACA territory.  There is some involvement of the right caudate
head.  The lentiform nucleus and thalamus are stable.  White matter
hypoattenuation within the internal capsule is stable bilaterally.

There is new hyperdensity within the sulci of the high right
frontal lobe concerning for focal subarachnoid hemorrhage.  There
is no definite to cortical infarct higher. The infarct involving
the corpus callosum appears to extend more posteriorly along the
corpus callosum is well.  The brain stem infarct is stable.
IMPRESSION: 1.  Continued slight enlargement of an area of hypoattenuation
involving the right corpus callosum.  Given its change over time,
this most likely represents infarct. This is unlikely to represent
neoplasm.  Infection is still considered.  If contrast is given,
CTA may be of use for evaluation.
2.  Focus of hypoattenuation involving the sulci of the right
frontal lobe.  This raises concern for subarachnoid hemorrhage.
Alternatively, this could represent pseudosubarachnoid hemorrhage
secondary to sulcal effacement.
3.  Otherwise stable white matter lesions and brain stem infarct.

Critical test results telephoned to Dr. Ceejay at the time of

## 2010-05-28 ENCOUNTER — Encounter: Payer: Self-pay | Admitting: Internal Medicine

## 2010-05-29 ENCOUNTER — Encounter: Payer: Self-pay | Admitting: Internal Medicine

## 2010-08-10 LAB — BASIC METABOLIC PANEL
BUN: 12 mg/dL (ref 6–23)
BUN: 7 mg/dL (ref 6–23)
CO2: 21 mEq/L (ref 19–32)
CO2: 24 mEq/L (ref 19–32)
CO2: 25 mEq/L (ref 19–32)
Chloride: 102 mEq/L (ref 96–112)
Chloride: 104 mEq/L (ref 96–112)
Chloride: 108 mEq/L (ref 96–112)
Creatinine, Ser: 1.94 mg/dL — ABNORMAL HIGH (ref 0.4–1.2)
GFR calc Af Amer: 29 mL/min — ABNORMAL LOW (ref >60)
GFR calc Af Amer: 32 mL/min — ABNORMAL LOW (ref >60)
GFR calc Af Amer: 45 mL/min — ABNORMAL LOW (ref >60)
GFR calc non Af Amer: 37 mL/min — ABNORMAL LOW (ref >60)
GFR calc non Af Amer: 41 mL/min — ABNORMAL LOW (ref >60)
Glucose, Bld: 104 mg/dL — ABNORMAL HIGH (ref 70–99)
Glucose, Bld: 159 mg/dL — ABNORMAL HIGH (ref 70–99)
Glucose, Bld: 95 mg/dL (ref 70–99)
Potassium: 4.1 mEq/L (ref 3.5–5.1)
Potassium: 4.1 mEq/L (ref 3.5–5.1)
Potassium: 4.4 mEq/L (ref 3.5–5.1)
Sodium: 134 mEq/L — ABNORMAL LOW (ref 135–145)
Sodium: 136 mEq/L (ref 135–145)
Sodium: 137 mEq/L (ref 135–145)
Sodium: 138 mEq/L (ref 135–145)

## 2010-08-10 LAB — CBC
HCT: 31.5 % — ABNORMAL LOW (ref 36.0–46.0)
HCT: 32.5 % — ABNORMAL LOW (ref 36.0–46.0)
HCT: 34.3 % — ABNORMAL LOW (ref 36.0–46.0)
HCT: 38.8 % (ref 36.0–46.0)
Hemoglobin: 10.3 g/dL — ABNORMAL LOW (ref 12.0–15.0)
Hemoglobin: 11.9 g/dL — ABNORMAL LOW (ref 12.0–15.0)
Hemoglobin: 12.9 g/dL (ref 12.0–15.0)
MCHC: 33.1 g/dL (ref 30.0–36.0)
MCHC: 33.2 g/dL (ref 30.0–36.0)
MCV: 90.1 fL (ref 78.0–100.0)
MCV: 90.3 fL (ref 78.0–100.0)
MCV: 90.4 fL (ref 78.0–100.0)
Platelets: 191 10*3/uL (ref 150–400)
Platelets: 212 10*3/uL (ref 150–400)
RBC: 3.81 MIL/uL — ABNORMAL LOW (ref 3.87–5.11)
RBC: 4 MIL/uL (ref 3.87–5.11)
RBC: 4.29 MIL/uL (ref 3.87–5.11)
RDW: 14.7 % (ref 11.5–15.5)
RDW: 14.8 % (ref 11.5–15.5)
RDW: 15.2 % (ref 11.5–15.5)
WBC: 7.6 10*3/uL (ref 4.0–10.5)
WBC: 7.6 10*3/uL (ref 4.0–10.5)
WBC: 8.3 10*3/uL (ref 4.0–10.5)
WBC: 9.5 10*3/uL (ref 4.0–10.5)

## 2010-08-10 LAB — PROTIME-INR
INR: 1.73 — ABNORMAL HIGH (ref 0.00–1.49)
INR: 1.77 — ABNORMAL HIGH (ref 0.00–1.49)
Prothrombin Time: 20.5 seconds — ABNORMAL HIGH (ref 11.6–15.2)

## 2010-08-10 LAB — GLUCOSE, CAPILLARY
Glucose-Capillary: 107 mg/dL — ABNORMAL HIGH (ref 70–99)
Glucose-Capillary: 107 mg/dL — ABNORMAL HIGH (ref 70–99)
Glucose-Capillary: 116 mg/dL — ABNORMAL HIGH (ref 70–99)
Glucose-Capillary: 122 mg/dL — ABNORMAL HIGH (ref 70–99)
Glucose-Capillary: 123 mg/dL — ABNORMAL HIGH (ref 70–99)
Glucose-Capillary: 127 mg/dL — ABNORMAL HIGH (ref 70–99)
Glucose-Capillary: 167 mg/dL — ABNORMAL HIGH (ref 70–99)
Glucose-Capillary: 187 mg/dL — ABNORMAL HIGH (ref 70–99)
Glucose-Capillary: 187 mg/dL — ABNORMAL HIGH (ref 70–99)
Glucose-Capillary: 90 mg/dL (ref 70–99)
Glucose-Capillary: 130 mg/dL — ABNORMAL HIGH (ref 70–99)

## 2010-08-10 LAB — DIFFERENTIAL
Basophils Absolute: 0 10*3/uL (ref 0.0–0.1)
Basophils Relative: 0 % (ref 0–1)
Eosinophils Absolute: 0.1 10*3/uL (ref 0.0–0.7)
Eosinophils Relative: 1 % (ref 0–5)
Eosinophils Relative: 2 % (ref 0–5)
Lymphocytes Relative: 27 % (ref 12–46)
Lymphs Abs: 2.1 10*3/uL (ref 0.7–4.0)
Monocytes Absolute: 0.6 10*3/uL (ref 0.1–1.0)
Monocytes Absolute: 0.6 10*3/uL (ref 0.1–1.0)
Monocytes Relative: 5 % (ref 3–12)
Monocytes Relative: 8 % (ref 3–12)
Neutro Abs: 10.1 10*3/uL — ABNORMAL HIGH (ref 1.7–7.7)

## 2010-08-10 LAB — LIPID PANEL
Total CHOL/HDL Ratio: 3.9 RATIO
VLDL: 22 mg/dL (ref 0–40)

## 2010-08-10 LAB — HEPATIC FUNCTION PANEL
ALT: <8 U/L (ref 0–35)
Bilirubin, Direct: 0.2 mg/dL (ref 0.0–0.3)
Indirect Bilirubin: 0.7 mg/dL (ref 0.3–0.9)
Total Protein: 7 g/dL (ref 6.0–8.3)

## 2010-08-10 LAB — URINE CULTURE

## 2010-08-10 LAB — CK TOTAL AND CKMB (NOT AT ARMC)
CK, MB: 1.4 ng/mL (ref 0.3–4.0)
Total CK: 63 U/L (ref 7–177)

## 2010-08-10 LAB — MAGNESIUM: Magnesium: 2.1 mg/dL (ref 1.5–2.5)

## 2010-08-10 LAB — T3, FREE: T3, Free: 3 pg/mL (ref 2.3–4.2)

## 2010-08-10 LAB — URINALYSIS, ROUTINE W REFLEX MICROSCOPIC
Ketones, ur: NEGATIVE mg/dL
Nitrite: NEGATIVE
Protein, ur: NEGATIVE mg/dL
Urobilinogen, UA: 0.2 mg/dL (ref 0.0–1.0)

## 2010-08-10 LAB — POCT CARDIAC MARKERS: CKMB, poc: <1 ng/mL — ABNORMAL LOW (ref 1.0–8.0)

## 2010-08-10 LAB — TSH: TSH: 0.115 u[IU]/mL — ABNORMAL LOW (ref 0.350–4.500)

## 2010-08-11 LAB — BASIC METABOLIC PANEL
BUN: 17 mg/dL (ref 6–23)
BUN: 28 mg/dL — ABNORMAL HIGH (ref 6–23)
BUN: 3 mg/dL — ABNORMAL LOW (ref 6–23)
BUN: 8 mg/dL (ref 6–23)
BUN: 39 mg/dL — ABNORMAL HIGH (ref 6–23)
CO2: 25 mEq/L (ref 19–32)
CO2: 27 mEq/L (ref 19–32)
CO2: 29 mEq/L (ref 19–32)
Calcium: 8.1 mg/dL — ABNORMAL LOW (ref 8.4–10.5)
Calcium: 8.6 mg/dL (ref 8.4–10.5)
Calcium: 9.2 mg/dL (ref 8.4–10.5)
Calcium: 9.4 mg/dL (ref 8.4–10.5)
Chloride: 113 mEq/L — ABNORMAL HIGH (ref 96–112)
Chloride: 114 mEq/L — ABNORMAL HIGH (ref 96–112)
Creatinine, Ser: 1.43 mg/dL — ABNORMAL HIGH (ref 0.4–1.2)
Creatinine, Ser: 1.47 mg/dL — ABNORMAL HIGH (ref 0.4–1.2)
Creatinine, Ser: 1.77 mg/dL — ABNORMAL HIGH (ref 0.4–1.2)
GFR calc Af Amer: 44 mL/min — ABNORMAL LOW (ref >60)
GFR calc Af Amer: 44 mL/min — ABNORMAL LOW (ref >60)
GFR calc non Af Amer: 35 mL/min — ABNORMAL LOW (ref >60)
GFR calc non Af Amer: 37 mL/min — ABNORMAL LOW (ref >60)
GFR calc non Af Amer: 47 mL/min — ABNORMAL LOW (ref >60)
GFR calc non Af Amer: 29 mL/min — ABNORMAL LOW (ref >60)
Glucose, Bld: 188 mg/dL — ABNORMAL HIGH (ref 70–99)
Glucose, Bld: 210 mg/dL — ABNORMAL HIGH (ref 70–99)
Glucose, Bld: 200 mg/dL — ABNORMAL HIGH (ref 70–99)
Potassium: 3.9 mEq/L (ref 3.5–5.1)
Potassium: 5.2 mEq/L — ABNORMAL HIGH (ref 3.5–5.1)
Potassium: 5.3 mEq/L — ABNORMAL HIGH (ref 3.5–5.1)
Potassium: 5.2 mEq/L — ABNORMAL HIGH (ref 3.5–5.1)
Sodium: 142 mEq/L (ref 135–145)
Sodium: 149 mEq/L — ABNORMAL HIGH (ref 135–145)
Sodium: 149 mEq/L — ABNORMAL HIGH (ref 135–145)

## 2010-08-11 LAB — CULTURE, BLOOD (ROUTINE X 2)
Culture: NO GROWTH
Culture: NO GROWTH

## 2010-08-11 LAB — DIFFERENTIAL
Basophils Absolute: 0 K/uL (ref 0.0–0.1)
Basophils Absolute: 0 K/uL (ref 0.0–0.1)
Basophils Relative: 0 % (ref 0–1)
Basophils Relative: 0 % (ref 0–1)
Eosinophils Absolute: 0.1 K/uL (ref 0.0–0.7)
Eosinophils Absolute: 0.3 K/uL (ref 0.0–0.7)
Eosinophils Relative: 1 % (ref 0–5)
Eosinophils Relative: 2 % (ref 0–5)
Lymphocytes Relative: 7 % — ABNORMAL LOW (ref 12–46)
Lymphocytes Relative: 7 % — ABNORMAL LOW (ref 12–46)
Lymphs Abs: 0.9 K/uL (ref 0.7–4.0)
Lymphs Abs: 1 K/uL (ref 0.7–4.0)
Monocytes Absolute: 0.8 K/uL (ref 0.1–1.0)
Monocytes Absolute: 0.7 K/uL (ref 0.1–1.0)
Monocytes Relative: 6 % (ref 3–12)
Monocytes Relative: 5 % (ref 3–12)
Neutro Abs: 11.9 K/uL — ABNORMAL HIGH (ref 1.7–7.7)
Neutro Abs: 12.9 K/uL — ABNORMAL HIGH (ref 1.7–7.7)
Neutrophils Relative %: 87 % — ABNORMAL HIGH (ref 43–77)
Neutrophils Relative %: 86 % — ABNORMAL HIGH (ref 43–77)

## 2010-08-11 LAB — BASIC METABOLIC PANEL WITH GFR
BUN: 38 mg/dL — ABNORMAL HIGH (ref 6–23)
CO2: 24 meq/L (ref 19–32)
Calcium: 9.2 mg/dL (ref 8.4–10.5)
Chloride: 112 meq/L (ref 96–112)
Creatinine, Ser: 1.66 mg/dL — ABNORMAL HIGH (ref 0.4–1.2)
GFR calc non Af Amer: 31 mL/min — ABNORMAL LOW
Glucose, Bld: 231 mg/dL — ABNORMAL HIGH (ref 70–99)
Potassium: 5.2 meq/L — ABNORMAL HIGH (ref 3.5–5.1)
Sodium: 147 meq/L — ABNORMAL HIGH (ref 135–145)

## 2010-08-11 LAB — PROTIME-INR
INR: 1.4 (ref 0.00–1.49)
INR: 1.4 (ref 0.00–1.49)
INR: 1.5 (ref 0.00–1.49)
INR: 2.1 — ABNORMAL HIGH (ref 0.00–1.49)
INR: 2.5 — ABNORMAL HIGH (ref 0.00–1.49)
INR: 3 — ABNORMAL HIGH (ref 0.00–1.49)
INR: >9.8 (ref 0.00–1.49)
Prothrombin Time: 18 seconds — ABNORMAL HIGH (ref 11.6–15.2)
Prothrombin Time: >90 s — ABNORMAL HIGH (ref 11.6–15.2)
Prothrombin Time: >90 seconds — ABNORMAL HIGH (ref 11.6–15.2)

## 2010-08-11 LAB — CBC
HCT: 28.9 % — ABNORMAL LOW (ref 36.0–46.0)
HCT: 29.3 % — ABNORMAL LOW (ref 36.0–46.0)
HCT: 31.9 % — ABNORMAL LOW (ref 36.0–46.0)
HCT: 32.6 % — ABNORMAL LOW (ref 36.0–46.0)
HCT: 33 % — ABNORMAL LOW (ref 36.0–46.0)
HCT: 33.5 % — ABNORMAL LOW (ref 36.0–46.0)
Hemoglobin: 10.7 g/dL — ABNORMAL LOW (ref 12.0–15.0)
Hemoglobin: 11.1 g/dL — ABNORMAL LOW (ref 12.0–15.0)
Hemoglobin: 9.7 g/dL — ABNORMAL LOW (ref 12.0–15.0)
Hemoglobin: 10.9 g/dL — ABNORMAL LOW (ref 12.0–15.0)
Hemoglobin: 11.5 g/dL — ABNORMAL LOW (ref 12.0–15.0)
MCHC: 32.4 g/dL (ref 30.0–36.0)
MCHC: 32.7 g/dL (ref 30.0–36.0)
MCHC: 33.7 g/dL (ref 30.0–36.0)
MCHC: 33.1 g/dL (ref 30.0–36.0)
MCHC: 34.2 g/dL (ref 30.0–36.0)
MCV: 90.3 fL (ref 78.0–100.0)
MCV: 90.6 fL (ref 78.0–100.0)
MCV: 90.8 fL (ref 78.0–100.0)
MCV: 90.4 fL (ref 78.0–100.0)
MCV: 88.5 fL (ref 78.0–100.0)
Platelets: 206 10*3/uL (ref 150–400)
Platelets: 248 10*3/uL (ref 150–400)
Platelets: 259 10*3/uL (ref 150–400)
Platelets: 291 10*3/uL (ref 150–400)
Platelets: 275 K/uL (ref 150–400)
RBC: 3.56 MIL/uL — ABNORMAL LOW (ref 3.87–5.11)
RBC: 3.61 MIL/uL — ABNORMAL LOW (ref 3.87–5.11)
RBC: 3.65 MIL/uL — ABNORMAL LOW (ref 3.87–5.11)
RBC: 3.79 MIL/uL — ABNORMAL LOW (ref 3.87–5.11)
RDW: 13.2 % (ref 11.5–15.5)
RDW: 13.7 % (ref 11.5–15.5)
RDW: 13.7 % (ref 11.5–15.5)
RDW: 13.6 % (ref 11.5–15.5)
RDW: 13.2 % (ref 11.5–15.5)
WBC: 10.9 10*3/uL — ABNORMAL HIGH (ref 4.0–10.5)
WBC: 13 10*3/uL — ABNORMAL HIGH (ref 4.0–10.5)
WBC: 9.2 10*3/uL (ref 4.0–10.5)
WBC: 9.7 10*3/uL (ref 4.0–10.5)
WBC: 14.9 K/uL — ABNORMAL HIGH (ref 4.0–10.5)

## 2010-08-11 LAB — HEPARIN LEVEL (UNFRACTIONATED)
Heparin Unfractionated: 0.64 IU/mL (ref 0.30–0.70)
Heparin Unfractionated: 0.75 IU/mL — ABNORMAL HIGH (ref 0.30–0.70)
Heparin Unfractionated: 0.95 IU/mL — ABNORMAL HIGH (ref 0.30–0.70)

## 2010-08-11 LAB — GLUCOSE, CAPILLARY
Glucose-Capillary: 126 mg/dL — ABNORMAL HIGH (ref 70–99)
Glucose-Capillary: 158 mg/dL — ABNORMAL HIGH (ref 70–99)
Glucose-Capillary: 175 mg/dL — ABNORMAL HIGH (ref 70–99)
Glucose-Capillary: 176 mg/dL — ABNORMAL HIGH (ref 70–99)
Glucose-Capillary: 179 mg/dL — ABNORMAL HIGH (ref 70–99)
Glucose-Capillary: 184 mg/dL — ABNORMAL HIGH (ref 70–99)
Glucose-Capillary: 208 mg/dL — ABNORMAL HIGH (ref 70–99)
Glucose-Capillary: 218 mg/dL — ABNORMAL HIGH (ref 70–99)
Glucose-Capillary: 223 mg/dL — ABNORMAL HIGH (ref 70–99)
Glucose-Capillary: 223 mg/dL — ABNORMAL HIGH (ref 70–99)
Glucose-Capillary: 229 mg/dL — ABNORMAL HIGH (ref 70–99)
Glucose-Capillary: 232 mg/dL — ABNORMAL HIGH (ref 70–99)
Glucose-Capillary: 285 mg/dL — ABNORMAL HIGH (ref 70–99)
Glucose-Capillary: 306 mg/dL — ABNORMAL HIGH (ref 70–99)
Glucose-Capillary: 89 mg/dL (ref 70–99)
Glucose-Capillary: 180 mg/dL — ABNORMAL HIGH (ref 70–99)
Glucose-Capillary: 185 mg/dL — ABNORMAL HIGH (ref 70–99)
Glucose-Capillary: 211 mg/dL — ABNORMAL HIGH (ref 70–99)
Glucose-Capillary: 226 mg/dL — ABNORMAL HIGH (ref 70–99)

## 2010-08-11 LAB — URINE CULTURE: Colony Count: >=100000

## 2010-08-11 LAB — COMPREHENSIVE METABOLIC PANEL
ALT: 12 U/L (ref 0–35)
Alkaline Phosphatase: 70 U/L (ref 39–117)
BUN: 47 mg/dL — ABNORMAL HIGH (ref 6–23)
Chloride: 109 mEq/L (ref 96–112)
Glucose, Bld: 188 mg/dL — ABNORMAL HIGH (ref 70–99)
Potassium: 5.2 mEq/L — ABNORMAL HIGH (ref 3.5–5.1)
Sodium: 146 mEq/L — ABNORMAL HIGH (ref 135–145)
Total Bilirubin: 0.6 mg/dL (ref 0.3–1.2)
Total Protein: 6.9 g/dL (ref 6.0–8.3)

## 2010-08-11 LAB — URINE MICROSCOPIC-ADD ON

## 2010-08-11 LAB — APTT
aPTT: 33 seconds (ref 24–37)
aPTT: 37 seconds (ref 24–37)
aPTT: 62 seconds — ABNORMAL HIGH (ref 24–37)
aPTT: 126 seconds — ABNORMAL HIGH (ref 24–37)

## 2010-08-11 LAB — URINALYSIS, ROUTINE W REFLEX MICROSCOPIC
Bilirubin Urine: NEGATIVE
Ketones, ur: NEGATIVE mg/dL
Specific Gravity, Urine: 1.014 (ref 1.005–1.030)
pH: 5 (ref 5.0–8.0)

## 2010-08-11 LAB — LIPASE, BLOOD: Lipase: 46 U/L (ref 23–300)

## 2010-08-11 LAB — WOUND CULTURE

## 2010-08-12 LAB — CBC
HCT: 36.7 % (ref 36.0–46.0)
HCT: 37.7 % (ref 36.0–46.0)
HCT: 38.3 % (ref 36.0–46.0)
HCT: 38.3 % (ref 36.0–46.0)
HCT: 31.9 % — ABNORMAL LOW (ref 36.0–46.0)
HCT: 32.7 % — ABNORMAL LOW (ref 36.0–46.0)
HCT: 33.3 % — ABNORMAL LOW (ref 36.0–46.0)
HCT: 33.6 % — ABNORMAL LOW (ref 36.0–46.0)
HCT: 35.1 % — ABNORMAL LOW (ref 36.0–46.0)
HCT: 35.4 % — ABNORMAL LOW (ref 36.0–46.0)
HCT: 35.6 % — ABNORMAL LOW (ref 36.0–46.0)
Hemoglobin: 12.5 g/dL (ref 12.0–15.0)
Hemoglobin: 12.7 g/dL (ref 12.0–15.0)
Hemoglobin: 13.1 g/dL (ref 12.0–15.0)
Hemoglobin: 11 g/dL — ABNORMAL LOW (ref 12.0–15.0)
Hemoglobin: 11.7 g/dL — ABNORMAL LOW (ref 12.0–15.0)
Hemoglobin: 11.8 g/dL — ABNORMAL LOW (ref 12.0–15.0)
Hemoglobin: 11.8 g/dL — ABNORMAL LOW (ref 12.0–15.0)
MCHC: 33.2 g/dL (ref 30.0–36.0)
MCHC: 33.2 g/dL (ref 30.0–36.0)
MCHC: 33.2 g/dL (ref 30.0–36.0)
MCHC: 33.3 g/dL (ref 30.0–36.0)
MCHC: 33.6 g/dL (ref 30.0–36.0)
MCHC: 33.6 g/dL (ref 30.0–36.0)
MCHC: 33 g/dL (ref 30.0–36.0)
MCHC: 33.1 g/dL (ref 30.0–36.0)
MCHC: 33.4 g/dL (ref 30.0–36.0)
MCHC: 33.6 g/dL (ref 30.0–36.0)
MCHC: 34.1 g/dL (ref 30.0–36.0)
MCHC: 35.1 g/dL (ref 30.0–36.0)
MCV: 90.3 fL (ref 78.0–100.0)
MCV: 91.4 fL (ref 78.0–100.0)
MCV: 91.6 fL (ref 78.0–100.0)
MCV: 91.7 fL (ref 78.0–100.0)
MCV: 91.8 fL (ref 78.0–100.0)
MCV: 90.5 fL (ref 78.0–100.0)
MCV: 90.6 fL (ref 78.0–100.0)
MCV: 90.7 fL (ref 78.0–100.0)
MCV: 91.3 fL (ref 78.0–100.0)
MCV: 91.4 fL (ref 78.0–100.0)
MCV: 91.9 fL (ref 78.0–100.0)
Platelets: 201 10*3/uL (ref 150–400)
Platelets: 232 10*3/uL (ref 150–400)
Platelets: 252 10*3/uL (ref 150–400)
Platelets: 202 10*3/uL (ref 150–400)
Platelets: 211 10*3/uL (ref 150–400)
Platelets: 215 10*3/uL (ref 150–400)
Platelets: 215 10*3/uL (ref 150–400)
Platelets: 216 10*3/uL (ref 150–400)
Platelets: 228 10*3/uL (ref 150–400)
Platelets: 236 10*3/uL (ref 150–400)
Platelets: 250 10*3/uL (ref 150–400)
Platelets: 293 10*3/uL (ref 150–400)
RBC: 4.13 MIL/uL (ref 3.87–5.11)
RBC: 4.19 MIL/uL (ref 3.87–5.11)
RBC: 4.31 MIL/uL (ref 3.87–5.11)
RBC: 3.6 MIL/uL — ABNORMAL LOW (ref 3.87–5.11)
RBC: 3.68 MIL/uL — ABNORMAL LOW (ref 3.87–5.11)
RDW: 13.2 % (ref 11.5–15.5)
RDW: 13.5 % (ref 11.5–15.5)
RDW: 13.9 % (ref 11.5–15.5)
RDW: 14 % (ref 11.5–15.5)
RDW: 14.1 % (ref 11.5–15.5)
RDW: 14.1 % (ref 11.5–15.5)
RDW: 14.2 % (ref 11.5–15.5)
RDW: 14.2 % (ref 11.5–15.5)
WBC: 10.5 10*3/uL (ref 4.0–10.5)
WBC: 11.4 10*3/uL — ABNORMAL HIGH (ref 4.0–10.5)
WBC: 8.1 10*3/uL (ref 4.0–10.5)
WBC: 8.5 10*3/uL (ref 4.0–10.5)
WBC: 6.6 10*3/uL (ref 4.0–10.5)
WBC: 6.8 10*3/uL (ref 4.0–10.5)
WBC: 7 10*3/uL (ref 4.0–10.5)
WBC: 7.3 10*3/uL (ref 4.0–10.5)
WBC: 7.4 10*3/uL (ref 4.0–10.5)
WBC: 8.1 10*3/uL (ref 4.0–10.5)

## 2010-08-12 LAB — GLUCOSE, CAPILLARY
Glucose-Capillary: 106 mg/dL — ABNORMAL HIGH (ref 70–99)
Glucose-Capillary: 113 mg/dL — ABNORMAL HIGH (ref 70–99)
Glucose-Capillary: 116 mg/dL — ABNORMAL HIGH (ref 70–99)
Glucose-Capillary: 117 mg/dL — ABNORMAL HIGH (ref 70–99)
Glucose-Capillary: 118 mg/dL — ABNORMAL HIGH (ref 70–99)
Glucose-Capillary: 123 mg/dL — ABNORMAL HIGH (ref 70–99)
Glucose-Capillary: 124 mg/dL — ABNORMAL HIGH (ref 70–99)
Glucose-Capillary: 124 mg/dL — ABNORMAL HIGH (ref 70–99)
Glucose-Capillary: 129 mg/dL — ABNORMAL HIGH (ref 70–99)
Glucose-Capillary: 134 mg/dL — ABNORMAL HIGH (ref 70–99)
Glucose-Capillary: 140 mg/dL — ABNORMAL HIGH (ref 70–99)
Glucose-Capillary: 156 mg/dL — ABNORMAL HIGH (ref 70–99)
Glucose-Capillary: 174 mg/dL — ABNORMAL HIGH (ref 70–99)
Glucose-Capillary: 101 mg/dL — ABNORMAL HIGH (ref 70–99)
Glucose-Capillary: 116 mg/dL — ABNORMAL HIGH (ref 70–99)
Glucose-Capillary: 116 mg/dL — ABNORMAL HIGH (ref 70–99)
Glucose-Capillary: 119 mg/dL — ABNORMAL HIGH (ref 70–99)
Glucose-Capillary: 120 mg/dL — ABNORMAL HIGH (ref 70–99)
Glucose-Capillary: 121 mg/dL — ABNORMAL HIGH (ref 70–99)
Glucose-Capillary: 128 mg/dL — ABNORMAL HIGH (ref 70–99)
Glucose-Capillary: 131 mg/dL — ABNORMAL HIGH (ref 70–99)
Glucose-Capillary: 132 mg/dL — ABNORMAL HIGH (ref 70–99)
Glucose-Capillary: 134 mg/dL — ABNORMAL HIGH (ref 70–99)
Glucose-Capillary: 137 mg/dL — ABNORMAL HIGH (ref 70–99)
Glucose-Capillary: 144 mg/dL — ABNORMAL HIGH (ref 70–99)
Glucose-Capillary: 145 mg/dL — ABNORMAL HIGH (ref 70–99)
Glucose-Capillary: 145 mg/dL — ABNORMAL HIGH (ref 70–99)
Glucose-Capillary: 149 mg/dL — ABNORMAL HIGH (ref 70–99)
Glucose-Capillary: 152 mg/dL — ABNORMAL HIGH (ref 70–99)
Glucose-Capillary: 159 mg/dL — ABNORMAL HIGH (ref 70–99)
Glucose-Capillary: 167 mg/dL — ABNORMAL HIGH (ref 70–99)
Glucose-Capillary: 176 mg/dL — ABNORMAL HIGH (ref 70–99)
Glucose-Capillary: 198 mg/dL — ABNORMAL HIGH (ref 70–99)
Glucose-Capillary: 267 mg/dL — ABNORMAL HIGH (ref 70–99)

## 2010-08-12 LAB — BASIC METABOLIC PANEL
BUN: 4 mg/dL — ABNORMAL LOW (ref 6–23)
BUN: 4 mg/dL — ABNORMAL LOW (ref 6–23)
CO2: 25 mEq/L (ref 19–32)
CO2: 27 mEq/L (ref 19–32)
CO2: 28 mEq/L (ref 19–32)
Calcium: 9.7 mg/dL (ref 8.4–10.5)
Chloride: 108 mEq/L (ref 96–112)
Chloride: 103 mEq/L (ref 96–112)
Creatinine, Ser: 1.51 mg/dL — ABNORMAL HIGH (ref 0.4–1.2)
Creatinine, Ser: 1.46 mg/dL — ABNORMAL HIGH (ref 0.4–1.2)
GFR calc Af Amer: 42 mL/min — ABNORMAL LOW (ref >60)
GFR calc non Af Amer: 36 mL/min — ABNORMAL LOW (ref >60)
Glucose, Bld: 155 mg/dL — ABNORMAL HIGH (ref 70–99)
Glucose, Bld: 97 mg/dL (ref 70–99)
Potassium: 3.5 mEq/L (ref 3.5–5.1)
Potassium: 3.5 mEq/L (ref 3.5–5.1)
Potassium: 3.9 mEq/L (ref 3.5–5.1)
Sodium: 142 mEq/L (ref 135–145)
Sodium: 143 mEq/L (ref 135–145)
Sodium: 138 mEq/L (ref 135–145)

## 2010-08-12 LAB — URINALYSIS, ROUTINE W REFLEX MICROSCOPIC
Glucose, UA: NEGATIVE mg/dL
Glucose, UA: NEGATIVE mg/dL
Hgb urine dipstick: NEGATIVE
Ketones, ur: 15 mg/dL — AB
Nitrite: NEGATIVE
Protein, ur: 30 mg/dL — AB
Specific Gravity, Urine: 1.016 (ref 1.005–1.030)
Specific Gravity, Urine: 1.015 (ref 1.005–1.030)
Urobilinogen, UA: 0.2 mg/dL (ref 0.0–1.0)
Urobilinogen, UA: 0.2 mg/dL (ref 0.0–1.0)
pH: 5.5 (ref 5.0–8.0)

## 2010-08-12 LAB — COMPREHENSIVE METABOLIC PANEL
AST: 19 U/L (ref 0–37)
Albumin: 3.4 g/dL — ABNORMAL LOW (ref 3.5–5.2)
BUN: 9 mg/dL (ref 6–23)
CO2: 26 mEq/L (ref 19–32)
Calcium: 9.4 mg/dL (ref 8.4–10.5)
Chloride: 108 mEq/L (ref 96–112)
Creatinine, Ser: 1.46 mg/dL — ABNORMAL HIGH (ref 0.4–1.2)
GFR calc Af Amer: 44 mL/min — ABNORMAL LOW (ref >60)
GFR calc non Af Amer: 36 mL/min — ABNORMAL LOW (ref >60)
Total Bilirubin: 1.1 mg/dL (ref 0.3–1.2)

## 2010-08-12 LAB — URINE MICROSCOPIC-ADD ON

## 2010-08-12 LAB — DIFFERENTIAL
Basophils Absolute: 0 10*3/uL (ref 0.0–0.1)
Basophils Relative: 0 % (ref 0–1)
Eosinophils Absolute: 0.2 10*3/uL (ref 0.0–0.7)
Eosinophils Relative: 2 % (ref 0–5)
Lymphocytes Relative: 22 % (ref 12–46)
Lymphs Abs: 2 10*3/uL (ref 0.7–4.0)
Monocytes Absolute: 0.5 10*3/uL (ref 0.1–1.0)
Monocytes Relative: 5 % (ref 3–12)
Neutro Abs: 6.3 10*3/uL (ref 1.7–7.7)
Neutrophils Relative %: 70 % (ref 43–77)

## 2010-08-12 LAB — HEPARIN LEVEL (UNFRACTIONATED)
Heparin Unfractionated: 0.57 IU/mL (ref 0.30–0.70)
Heparin Unfractionated: 1.38 IU/mL — ABNORMAL HIGH (ref 0.30–0.70)
Heparin Unfractionated: >2 IU/mL — ABNORMAL HIGH (ref 0.30–0.70)

## 2010-08-12 LAB — PROTIME-INR
INR: 1 (ref 0.00–1.49)
INR: 2.1 — ABNORMAL HIGH (ref 0.00–1.49)
INR: 4.1 — ABNORMAL HIGH (ref 0.00–1.49)
Prothrombin Time: 13.4 seconds (ref 11.6–15.2)
Prothrombin Time: 23.4 seconds — ABNORMAL HIGH (ref 11.6–15.2)
Prothrombin Time: 39.7 seconds — ABNORMAL HIGH (ref 11.6–15.2)

## 2010-08-12 LAB — POCT CARDIAC MARKERS: Myoglobin, poc: 231 ng/mL (ref 12–200)

## 2010-08-12 LAB — URINE CULTURE
Colony Count: NO GROWTH
Colony Count: >=100000

## 2010-08-12 LAB — HEMOCCULT GUIAC POC 1CARD (OFFICE): Fecal Occult Bld: NEGATIVE

## 2010-08-13 LAB — GLUCOSE, CAPILLARY
Glucose-Capillary: 104 mg/dL — ABNORMAL HIGH (ref 70–99)
Glucose-Capillary: 111 mg/dL — ABNORMAL HIGH (ref 70–99)
Glucose-Capillary: 118 mg/dL — ABNORMAL HIGH (ref 70–99)
Glucose-Capillary: 119 mg/dL — ABNORMAL HIGH (ref 70–99)
Glucose-Capillary: 120 mg/dL — ABNORMAL HIGH (ref 70–99)
Glucose-Capillary: 123 mg/dL — ABNORMAL HIGH (ref 70–99)
Glucose-Capillary: 126 mg/dL — ABNORMAL HIGH (ref 70–99)
Glucose-Capillary: 129 mg/dL — ABNORMAL HIGH (ref 70–99)
Glucose-Capillary: 129 mg/dL — ABNORMAL HIGH (ref 70–99)
Glucose-Capillary: 134 mg/dL — ABNORMAL HIGH (ref 70–99)
Glucose-Capillary: 136 mg/dL — ABNORMAL HIGH (ref 70–99)
Glucose-Capillary: 138 mg/dL — ABNORMAL HIGH (ref 70–99)
Glucose-Capillary: 149 mg/dL — ABNORMAL HIGH (ref 70–99)
Glucose-Capillary: 153 mg/dL — ABNORMAL HIGH (ref 70–99)
Glucose-Capillary: 153 mg/dL — ABNORMAL HIGH (ref 70–99)
Glucose-Capillary: 158 mg/dL — ABNORMAL HIGH (ref 70–99)
Glucose-Capillary: 159 mg/dL — ABNORMAL HIGH (ref 70–99)
Glucose-Capillary: 169 mg/dL — ABNORMAL HIGH (ref 70–99)
Glucose-Capillary: 174 mg/dL — ABNORMAL HIGH (ref 70–99)
Glucose-Capillary: 178 mg/dL — ABNORMAL HIGH (ref 70–99)
Glucose-Capillary: 183 mg/dL — ABNORMAL HIGH (ref 70–99)
Glucose-Capillary: 186 mg/dL — ABNORMAL HIGH (ref 70–99)
Glucose-Capillary: 186 mg/dL — ABNORMAL HIGH (ref 70–99)
Glucose-Capillary: 188 mg/dL — ABNORMAL HIGH (ref 70–99)
Glucose-Capillary: 194 mg/dL — ABNORMAL HIGH (ref 70–99)
Glucose-Capillary: 225 mg/dL — ABNORMAL HIGH (ref 70–99)
Glucose-Capillary: 230 mg/dL — ABNORMAL HIGH (ref 70–99)
Glucose-Capillary: 63 mg/dL — ABNORMAL LOW (ref 70–99)
Glucose-Capillary: 76 mg/dL (ref 70–99)
Glucose-Capillary: 98 mg/dL (ref 70–99)
Glucose-Capillary: 104 mg/dL — ABNORMAL HIGH (ref 70–99)
Glucose-Capillary: 113 mg/dL — ABNORMAL HIGH (ref 70–99)
Glucose-Capillary: 125 mg/dL — ABNORMAL HIGH (ref 70–99)
Glucose-Capillary: 126 mg/dL — ABNORMAL HIGH (ref 70–99)
Glucose-Capillary: 135 mg/dL — ABNORMAL HIGH (ref 70–99)
Glucose-Capillary: 139 mg/dL — ABNORMAL HIGH (ref 70–99)
Glucose-Capillary: 141 mg/dL — ABNORMAL HIGH (ref 70–99)
Glucose-Capillary: 147 mg/dL — ABNORMAL HIGH (ref 70–99)
Glucose-Capillary: 147 mg/dL — ABNORMAL HIGH (ref 70–99)
Glucose-Capillary: 157 mg/dL — ABNORMAL HIGH (ref 70–99)
Glucose-Capillary: 158 mg/dL — ABNORMAL HIGH (ref 70–99)
Glucose-Capillary: 158 mg/dL — ABNORMAL HIGH (ref 70–99)
Glucose-Capillary: 161 mg/dL — ABNORMAL HIGH (ref 70–99)
Glucose-Capillary: 164 mg/dL — ABNORMAL HIGH (ref 70–99)
Glucose-Capillary: 168 mg/dL — ABNORMAL HIGH (ref 70–99)
Glucose-Capillary: 171 mg/dL — ABNORMAL HIGH (ref 70–99)
Glucose-Capillary: 176 mg/dL — ABNORMAL HIGH (ref 70–99)
Glucose-Capillary: 179 mg/dL — ABNORMAL HIGH (ref 70–99)
Glucose-Capillary: 180 mg/dL — ABNORMAL HIGH (ref 70–99)
Glucose-Capillary: 185 mg/dL — ABNORMAL HIGH (ref 70–99)
Glucose-Capillary: 186 mg/dL — ABNORMAL HIGH (ref 70–99)
Glucose-Capillary: 186 mg/dL — ABNORMAL HIGH (ref 70–99)
Glucose-Capillary: 189 mg/dL — ABNORMAL HIGH (ref 70–99)
Glucose-Capillary: 196 mg/dL — ABNORMAL HIGH (ref 70–99)
Glucose-Capillary: 199 mg/dL — ABNORMAL HIGH (ref 70–99)
Glucose-Capillary: 204 mg/dL — ABNORMAL HIGH (ref 70–99)
Glucose-Capillary: 207 mg/dL — ABNORMAL HIGH (ref 70–99)
Glucose-Capillary: 218 mg/dL — ABNORMAL HIGH (ref 70–99)
Glucose-Capillary: 218 mg/dL — ABNORMAL HIGH (ref 70–99)
Glucose-Capillary: 257 mg/dL — ABNORMAL HIGH (ref 70–99)
Glucose-Capillary: 264 mg/dL — ABNORMAL HIGH (ref 70–99)
Glucose-Capillary: 296 mg/dL — ABNORMAL HIGH (ref 70–99)
Glucose-Capillary: 87 mg/dL (ref 70–99)

## 2010-08-13 LAB — DIFFERENTIAL
Basophils Absolute: 0 10*3/uL (ref 0.0–0.1)
Basophils Absolute: 0 10*3/uL (ref 0.0–0.1)
Basophils Absolute: 0 10*3/uL (ref 0.0–0.1)
Basophils Relative: 1 % (ref 0–1)
Basophils Relative: 0 % (ref 0–1)
Eosinophils Absolute: 0.2 10*3/uL (ref 0.0–0.7)
Eosinophils Absolute: 0.2 10*3/uL (ref 0.0–0.7)
Eosinophils Relative: 1 % (ref 0–5)
Eosinophils Relative: 2 % (ref 0–5)
Eosinophils Relative: 2 % (ref 0–5)
Lymphocytes Relative: 14 % (ref 12–46)
Lymphocytes Relative: 24 % (ref 12–46)
Lymphocytes Relative: 26 % (ref 12–46)
Lymphs Abs: 1.4 10*3/uL (ref 0.7–4.0)
Lymphs Abs: 2.8 10*3/uL (ref 0.7–4.0)
Lymphs Abs: 2.9 10*3/uL (ref 0.7–4.0)
Monocytes Absolute: 0.5 10*3/uL (ref 0.1–1.0)
Monocytes Absolute: 0.4 10*3/uL (ref 0.1–1.0)
Monocytes Absolute: 0.7 10*3/uL (ref 0.1–1.0)
Monocytes Absolute: 0.7 10*3/uL (ref 0.1–1.0)
Monocytes Relative: 5 % (ref 3–12)
Monocytes Relative: 5 % (ref 3–12)
Monocytes Relative: 6 % (ref 3–12)
Monocytes Relative: 6 % (ref 3–12)
Neutro Abs: 8.7 10*3/uL — ABNORMAL HIGH (ref 1.7–7.7)
Neutro Abs: 8.9 10*3/uL — ABNORMAL HIGH (ref 1.7–7.7)
Neutro Abs: 7 10*3/uL (ref 1.7–7.7)
Neutro Abs: 8.6 10*3/uL — ABNORMAL HIGH (ref 1.7–7.7)
Neutrophils Relative %: 81 % — ABNORMAL HIGH (ref 43–77)
Neutrophils Relative %: 66 % (ref 43–77)

## 2010-08-13 LAB — BASIC METABOLIC PANEL
BUN: 6 mg/dL (ref 6–23)
BUN: 10 mg/dL (ref 6–23)
BUN: 11 mg/dL (ref 6–23)
BUN: 14 mg/dL (ref 6–23)
BUN: 17 mg/dL (ref 6–23)
BUN: 34 mg/dL — ABNORMAL HIGH (ref 6–23)
BUN: 39 mg/dL — ABNORMAL HIGH (ref 6–23)
BUN: 8 mg/dL (ref 6–23)
CO2: 28 mEq/L (ref 19–32)
CO2: 23 mEq/L (ref 19–32)
CO2: 25 mEq/L (ref 19–32)
CO2: 25 mEq/L (ref 19–32)
CO2: 26 mEq/L (ref 19–32)
CO2: 27 mEq/L (ref 19–32)
Calcium: 9 mg/dL (ref 8.4–10.5)
Calcium: 8.7 mg/dL (ref 8.4–10.5)
Calcium: 8.9 mg/dL (ref 8.4–10.5)
Calcium: 9 mg/dL (ref 8.4–10.5)
Calcium: 9 mg/dL (ref 8.4–10.5)
Calcium: 9.1 mg/dL (ref 8.4–10.5)
Calcium: 9.4 mg/dL (ref 8.4–10.5)
Chloride: 108 mEq/L (ref 96–112)
Chloride: 108 mEq/L (ref 96–112)
Chloride: 108 mEq/L (ref 96–112)
Chloride: 108 mEq/L (ref 96–112)
Chloride: 112 mEq/L (ref 96–112)
Creatinine, Ser: 1.28 mg/dL — ABNORMAL HIGH (ref 0.4–1.2)
Creatinine, Ser: 1.42 mg/dL — ABNORMAL HIGH (ref 0.4–1.2)
Creatinine, Ser: 1.49 mg/dL — ABNORMAL HIGH (ref 0.4–1.2)
Creatinine, Ser: 1.53 mg/dL — ABNORMAL HIGH (ref 0.4–1.2)
GFR calc Af Amer: 42 mL/min — ABNORMAL LOW (ref >60)
GFR calc Af Amer: 43 mL/min — ABNORMAL LOW (ref >60)
GFR calc Af Amer: 45 mL/min — ABNORMAL LOW (ref >60)
GFR calc Af Amer: 45 mL/min — ABNORMAL LOW (ref >60)
GFR calc Af Amer: 51 mL/min — ABNORMAL LOW (ref >60)
GFR calc non Af Amer: 23 mL/min — ABNORMAL LOW (ref >60)
GFR calc non Af Amer: 23 mL/min — ABNORMAL LOW (ref >60)
GFR calc non Af Amer: 34 mL/min — ABNORMAL LOW (ref >60)
GFR calc non Af Amer: 35 mL/min — ABNORMAL LOW (ref >60)
GFR calc non Af Amer: 37 mL/min — ABNORMAL LOW (ref >60)
GFR calc non Af Amer: 40 mL/min — ABNORMAL LOW (ref >60)
GFR calc non Af Amer: 42 mL/min — ABNORMAL LOW (ref >60)
Glucose, Bld: 123 mg/dL — ABNORMAL HIGH (ref 70–99)
Glucose, Bld: 122 mg/dL — ABNORMAL HIGH (ref 70–99)
Glucose, Bld: 126 mg/dL — ABNORMAL HIGH (ref 70–99)
Glucose, Bld: 135 mg/dL — ABNORMAL HIGH (ref 70–99)
Glucose, Bld: 142 mg/dL — ABNORMAL HIGH (ref 70–99)
Glucose, Bld: 216 mg/dL — ABNORMAL HIGH (ref 70–99)
Potassium: 3.9 mEq/L (ref 3.5–5.1)
Potassium: 4.1 mEq/L (ref 3.5–5.1)
Potassium: 4.2 mEq/L (ref 3.5–5.1)
Potassium: 4.3 mEq/L (ref 3.5–5.1)
Potassium: 4.6 mEq/L (ref 3.5–5.1)
Sodium: 143 mEq/L (ref 135–145)
Sodium: 137 mEq/L (ref 135–145)
Sodium: 138 mEq/L (ref 135–145)
Sodium: 138 mEq/L (ref 135–145)
Sodium: 140 mEq/L (ref 135–145)
Sodium: 141 mEq/L (ref 135–145)
Sodium: 141 mEq/L (ref 135–145)
Sodium: 142 mEq/L (ref 135–145)

## 2010-08-13 LAB — LIPID PANEL
Cholesterol: 178 mg/dL (ref 0–200)
HDL: 29 mg/dL — ABNORMAL LOW (ref >39)
LDL Cholesterol: 117 mg/dL — ABNORMAL HIGH (ref 0–99)
Total CHOL/HDL Ratio: 6.1 RATIO
Triglycerides: 161 mg/dL — ABNORMAL HIGH (ref <150)
VLDL: 32 mg/dL (ref 0–40)

## 2010-08-13 LAB — URINALYSIS, ROUTINE W REFLEX MICROSCOPIC
Bilirubin Urine: NEGATIVE
Glucose, UA: NEGATIVE mg/dL
Hgb urine dipstick: NEGATIVE
Ketones, ur: 15 mg/dL — AB
Nitrite: NEGATIVE
Protein, ur: NEGATIVE mg/dL
Protein, ur: NEGATIVE mg/dL
Specific Gravity, Urine: 1.014 (ref 1.005–1.030)
Specific Gravity, Urine: 1.014 (ref 1.005–1.030)
Urobilinogen, UA: 0.2 mg/dL (ref 0.0–1.0)
Urobilinogen, UA: 0.2 mg/dL (ref 0.0–1.0)

## 2010-08-13 LAB — URINE CULTURE
Colony Count: >=100000
Colony Count: NO GROWTH
Culture: NO GROWTH

## 2010-08-13 LAB — CBC
HCT: 29.1 % — ABNORMAL LOW (ref 36.0–46.0)
HCT: 30.3 % — ABNORMAL LOW (ref 36.0–46.0)
HCT: 30.7 % — ABNORMAL LOW (ref 36.0–46.0)
HCT: 32.1 % — ABNORMAL LOW (ref 36.0–46.0)
HCT: 32.9 % — ABNORMAL LOW (ref 36.0–46.0)
HCT: 33.4 % — ABNORMAL LOW (ref 36.0–46.0)
HCT: 36.7 % (ref 36.0–46.0)
HCT: 37.7 % (ref 36.0–46.0)
HCT: 39.4 % (ref 36.0–46.0)
HCT: 42 % (ref 36.0–46.0)
Hemoglobin: 10.5 g/dL — ABNORMAL LOW (ref 12.0–15.0)
Hemoglobin: 10.5 g/dL — ABNORMAL LOW (ref 12.0–15.0)
Hemoglobin: 10.9 g/dL — ABNORMAL LOW (ref 12.0–15.0)
Hemoglobin: 11 g/dL — ABNORMAL LOW (ref 12.0–15.0)
Hemoglobin: 11 g/dL — ABNORMAL LOW (ref 12.0–15.0)
Hemoglobin: 12.2 g/dL (ref 12.0–15.0)
Hemoglobin: 12.6 g/dL (ref 12.0–15.0)
Hemoglobin: 13.1 g/dL (ref 12.0–15.0)
MCHC: 32.3 g/dL (ref 30.0–36.0)
MCHC: 33.3 g/dL (ref 30.0–36.0)
MCHC: 33.4 g/dL (ref 30.0–36.0)
MCHC: 34 g/dL (ref 30.0–36.0)
MCHC: 34.1 g/dL (ref 30.0–36.0)
MCHC: 33.4 g/dL (ref 30.0–36.0)
MCV: 91.2 fL (ref 78.0–100.0)
MCV: 89.4 fL (ref 78.0–100.0)
MCV: 90.5 fL (ref 78.0–100.0)
Platelets: 183 10*3/uL (ref 150–400)
Platelets: 192 10*3/uL (ref 150–400)
Platelets: 201 10*3/uL (ref 150–400)
Platelets: 205 10*3/uL (ref 150–400)
Platelets: 208 10*3/uL (ref 150–400)
Platelets: 251 10*3/uL (ref 150–400)
Platelets: 268 10*3/uL (ref 150–400)
Platelets: 273 10*3/uL (ref 150–400)
Platelets: 281 10*3/uL (ref 150–400)
RBC: 2.89 MIL/uL — ABNORMAL LOW (ref 3.87–5.11)
RBC: 3.12 MIL/uL — ABNORMAL LOW (ref 3.87–5.11)
RBC: 3.44 MIL/uL — ABNORMAL LOW (ref 3.87–5.11)
RBC: 3.59 MIL/uL — ABNORMAL LOW (ref 3.87–5.11)
RBC: 3.65 MIL/uL — ABNORMAL LOW (ref 3.87–5.11)
RBC: 4.33 MIL/uL (ref 3.87–5.11)
RBC: 4.35 MIL/uL (ref 3.87–5.11)
RBC: 4.36 MIL/uL (ref 3.87–5.11)
RDW: 14 % (ref 11.5–15.5)
RDW: 14.1 % (ref 11.5–15.5)
RDW: 14.2 % (ref 11.5–15.5)
RDW: 14.3 % (ref 11.5–15.5)
RDW: 14.3 % (ref 11.5–15.5)
RDW: 14.4 % (ref 11.5–15.5)
RDW: 13.6 % (ref 11.5–15.5)
RDW: 13.6 % (ref 11.5–15.5)
RDW: 14 % (ref 11.5–15.5)
WBC: 10.8 10*3/uL — ABNORMAL HIGH (ref 4.0–10.5)
WBC: 10.8 10*3/uL — ABNORMAL HIGH (ref 4.0–10.5)
WBC: 10.8 10*3/uL — ABNORMAL HIGH (ref 4.0–10.5)
WBC: 6.7 10*3/uL (ref 4.0–10.5)
WBC: 7.3 10*3/uL (ref 4.0–10.5)
WBC: 10.4 10*3/uL (ref 4.0–10.5)
WBC: 10.8 10*3/uL — ABNORMAL HIGH (ref 4.0–10.5)
WBC: 11.1 10*3/uL — ABNORMAL HIGH (ref 4.0–10.5)
WBC: 11.7 10*3/uL — ABNORMAL HIGH (ref 4.0–10.5)
WBC: 11.9 10*3/uL — ABNORMAL HIGH (ref 4.0–10.5)
WBC: 12.4 10*3/uL — ABNORMAL HIGH (ref 4.0–10.5)

## 2010-08-13 LAB — CROSSMATCH
ABO/RH(D): O POS
Antibody Screen: NEGATIVE

## 2010-08-13 LAB — COMPREHENSIVE METABOLIC PANEL
ALT: 10 U/L (ref 0–35)
AST: 25 U/L (ref 0–37)
AST: 15 U/L (ref 0–37)
AST: 18 U/L (ref 0–37)
Albumin: 2.7 g/dL — ABNORMAL LOW (ref 3.5–5.2)
Albumin: 3 g/dL — ABNORMAL LOW (ref 3.5–5.2)
Albumin: 3.2 g/dL — ABNORMAL LOW (ref 3.5–5.2)
BUN: 12 mg/dL (ref 6–23)
BUN: 29 mg/dL — ABNORMAL HIGH (ref 6–23)
CO2: 27 mEq/L (ref 19–32)
Calcium: 8.9 mg/dL (ref 8.4–10.5)
Chloride: 102 mEq/L (ref 96–112)
Chloride: 106 mEq/L (ref 96–112)
Creatinine, Ser: 1.28 mg/dL — ABNORMAL HIGH (ref 0.4–1.2)
Creatinine, Ser: 2.23 mg/dL — ABNORMAL HIGH (ref 0.4–1.2)
GFR calc Af Amer: 51 mL/min — ABNORMAL LOW (ref >60)
GFR calc Af Amer: 27 mL/min — ABNORMAL LOW (ref >60)
GFR calc Af Amer: 45 mL/min — ABNORMAL LOW (ref >60)
GFR calc non Af Amer: 37 mL/min — ABNORMAL LOW (ref >60)
Potassium: 4 mEq/L (ref 3.5–5.1)
Potassium: 4.4 mEq/L (ref 3.5–5.1)
Sodium: 138 mEq/L (ref 135–145)
Total Bilirubin: 0.6 mg/dL (ref 0.3–1.2)
Total Bilirubin: 0.9 mg/dL (ref 0.3–1.2)
Total Protein: 5.5 g/dL — ABNORMAL LOW (ref 6.0–8.3)
Total Protein: 6 g/dL (ref 6.0–8.3)

## 2010-08-13 LAB — URINE MICROSCOPIC-ADD ON

## 2010-08-13 LAB — ABO/RH: ABO/RH(D): O POS

## 2010-08-13 LAB — PROTIME-INR
INR: 1.1 (ref 0.00–1.49)
Prothrombin Time: 14.3 seconds (ref 11.6–15.2)

## 2010-08-13 LAB — HEMOGLOBIN A1C
Hgb A1c MFr Bld: 8.5 % — ABNORMAL HIGH (ref 4.6–6.1)
Mean Plasma Glucose: 197 mg/dL

## 2010-08-13 LAB — VITAMIN B12: Vitamin B-12: 759 pg/mL (ref 211–911)

## 2010-08-13 LAB — RPR: RPR Ser Ql: NONREACTIVE

## 2010-09-19 NOTE — H&P (Signed)
[name], [name] NO.:  [id number]   MEDICAL RECORD NO.:  [id number]          PATIENT TYPE:  IPS   LOCATION:  4035                         FACILITY:  [facility]   PHYSICIAN:  Ranelle Oyster, M.D.DATE OF BIRTH:  October 24, 1945   DATE OF ADMISSION:  11/12/2008  DATE OF DISCHARGE:                              HISTORY & PHYSICAL   PRIMARY CARE Aliahna Statzer:  [name], MD   CARDIOLOGIST:  Cristy Hilts. Jacinto Halim, MD   NEUROLOGIST:  Levert Feinstein, MD   CHIEF COMPLAINTS:  Left-sided weakness and confusion.   HISTORY OF PRESENT ILLNESS:  This is a 65 year old African American  female with diabetes and cardiomyopathy, admitted on November 06, 2008, with  dizziness and falls and left-sided weakness.  Head CT show right frontal  and right corpus callosum hyperdensity.  Carotid Dopplers were  unremarkable.  Echocardiogram is notable for EF of 40-45%, diffuse  hypokinesis, but no source of embolus was identified.  Carotid Dopplers  without ICA stenosis.  The patient was treated for Enterobacter UTI with  Cipro x3 days.  CTA of the neck is notable for right greater than left  carotid artery atherosclerotic disease without overt stenosis.  She had  right ACA occlusion in the distal right A2 segment with moderate severe  tandem stenosis in left A2 segment, moderate to severe atherosclerosis  in bilateral MCA, stable right ACA infarct without hemorrhage or mass  effect.  There was some question of this bleed on initial head CT, but  no blood was seen obviously on the CTA.  Neurology recommended change to  Plavix for stroke prophylaxis.  She has been placed on Ritalin for  activation.  The patient continued to have problems with cognition,  awareness, and left-sided weakness.  The patient has had mild  oropharyngeal dysphagia on a regular diet and thin liquids.  Rehab  consulted on the patient on November 10, 2008, and felt that she ultimately  could benefit from a rehab admission and thus she was  brought today.   REVIEW OF SYSTEMS:  Notable for weakness, numbness, and intermittent  confusion.  Other pertinent positives are above, and full review is in  the written H and P.   PAST MEDICAL HISTORY:  Positive for:  1. Type 2 diabetes.  2. Chronic renal insufficiency.  3. Nonischemic cardiomyopathy.  4. Mitral valve regurgitation with repair in 2005.  5. Anxiety.  6. Dyslipidemia.  7. Ovarian cancer.  8. Noncompliance with medications.  9. CHF.   FAMILY HISTORY:  Positive for CVA.   SOCIAL HISTORY:  The patient is married but separated.  She lives alone  in 1-level house, 3 steps to enter.  She smokes 2-3 cigarettes a day and  has a remote history of alcohol abuse.   ALLERGIES:  LOTENSIN.   HOME MEDICATIONS:  Coreg, Lantus, Lipitor, NovoLog, lisinopril,  spironolactone, nystatin, Norvasc, and niacin.   LABORATORY DATA:  Hemoglobin 12.9, white count 10.8, platelets 312.  Sodium 138, potassium 4.4, BUN 9, creatinine 1.43.   PHYSICAL EXAMINATION:  VITAL SIGNS:  Blood pressure 132/84, pulse is 69,  respiratory  rate is 22, temperature 97.1.  GENERAL:  The patient is generally awake, alert, very flat.  HEENT:  Pupils equal, round, and reactive to light.  Ear, nose, and  throat exam is notable for missing teeth but pink moist mucosa.  NECK:  Supple without JVD or lymphadenopathy.  CHEST:  Clear to auscultation bilaterally without wheezes, rales, or  rhonchi.  HEART:  Regular rate and rhythm without murmur, rubs, or gallops.  EXTREMITIES:  No clubbing, cyanosis, or edema.  ABDOMEN:  Soft, nontender, bowel sounds are positive.  SKIN:  Generally intact throughout.  NEUROLOGIC:  Cranial nerves II through XII showed mild left central VII.  Speech is slightly dysarthric.  Reflexes are 1+ throughout.  Sensation  is perhaps a bit decreased to pinprick in the left arm and leg on exam.  There was no overt left hemi-inattention.  She tracked all fields.  The  patient did have  occasional problems with word finding.  She had  definite difficulties with sequencing, awareness, initiation, etc.  The  patient's memory was poor as well.  No hallucinations were seen.  She is  able to follow 1-step command generally without significant difficulty.  She was able to tell me that she was in the rehab hospital at [facility].  Strength is generally 4/5 in left upper extremity and 3/5 in left  lower extremity.  She is 5/5 in right upper extremities, 4/5 in right  lower extremity.  No clonus was seen in the ankles.  Toes were  nonreactive.   POST ADMISSION PHYSICIAN EVALUATION:  1. 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  The patient with left      hemiparesis, mild dysarthria, and confusion.  2. The patient is admitted to receive collaborative interdisciplinary      care between the physiatrist, rehab nursing staff, and therapy      team.  3. The patient's level of medical complexity and substantial therapy      needs in context of that medical necessity cannot be provided at a      lesser intensity of care.  4. The patient has experienced substantial functional loss from her      baseline.  Premorbidly, she was independent.  The daughter did note      some temporary mental status changes leading up to the stroke,      however.  During the rehab evaluation, the patient was min to mod      assist with bed mobility and transfers and min to mod assist with      dressing and bathing.  Within the last 24 hours, she is min to mod      assist for transfers having difficulties advancing her legs for any      type of gait.  She is total assist for posture (50%).  She is min      assist grooming with frequent falls to left when seated.  Judging      by the patient's diagnosis, physical exam, and functional history,      she has potential for functional progress, which will result in      measurable gains while in  inpatient rehab.  These gains will be of      substantial and practical use upon discharge to home in      facilitating mobility and self-care.  Interim changes in medical      status since  our rehab consult are detailed above.  5. Physiatrist will provide 24-hour management of medical needs, as      well as oversight of the therapy plan/treatment, and provide      guidance as appropriate regarding the interaction of the 2.      Medical problem list and plan are below.  6. 24-hour rehab nursing will assist in management of the patient's      bowel and bladder function, nutrition, skin care, and integration      of therapy, concepts, techniques, education, etc.  7. PT will assess and treat for lower extremity strength, visual      spatial awareness, cognitive perceptual training, functional      mobility, gait, and family education.  Goals supervision to perhaps      occasional min assist.  8. OT will assess and treat for upper extremity use, ADLs, adaptive      techniques, equipment safety, awareness, family education, and      cognitive perceptual training and neuromuscular reeducation with      goals supervision to min assist.  9. Speech Language Pathology will assess and treat for cognition and      swallowing with goals modified independent to occasional      supervision.  10.Case management/social worker will assess and treat for      psychosocial issues and discharge planning.  11.Team conferences will be held weekly to assess progress towards      goals and to determine barriers at discharge.  12.The patient has demonstrated sufficient medical stability and      exercise capacity to tolerate at least 3 hours of therapy per day,      at least 5 days per week.  13.Estimated length of stay is 2-1/2 weeks to 3 weeks.  Prognosis is      fair to good.   MEDICAL PROBLEM LIST AND PLAN:  1. Hypertension:  Continue Lotensin, Coreg, and spironolactone.  We      will follow high blood  pressure serially and adjust medications as      indicated going forth.  2. Diabetes, type 2:  Resume mealtime coverage.  The patient is on a      carb-modified medium diet.  Lantus insulin on board 40 units      nightly as well.  We will adjust accordingly going forward.  3. Dyslipidemia:  Lipitor/resume niacin.  4. Congestive heart failure:  Coreg and Aldactone are being given.      Follow fluid and weight on a regular basis.  5. Stroke prophylaxis:  Plavix 75 mg p.o. daily.  6. Deep venous thrombosis prophylaxis with sequential compression      devices and knee-high TED hose.  Also heparin 5000 units subcu q.8      h.  7. Confusion:  Recheck UA and C and S and followup electrolytes on      admission lab work.  Some of the confusion may be due to shock from      her stroke.  There are no overt language deficits on exam, but her      language appears to be more that of confusion than anything else.      Continue with Ritalin daily at 7:00 a.m. and noon for activation.      Ranelle Oyster, M.D.  Electronically Signed     ZTS/MEDQ  D:  11/12/2008  T:  11/13/2008  Job:  [id number]   cc:   [name], M.D.  Cristy Hilts. Jacinto Halim, MD  Levert Feinstein, MD

## 2010-09-19 NOTE — Consult Note (Signed)
[name], [name] NO.:  [id number]   MEDICAL RECORD NO.:  [id number]          PATIENT TYPE:  IPS   LOCATION:  4035                         FACILITY:  [facility]   PHYSICIAN:  Graylin Shiver, M.D.   DATE OF BIRTH:  26-Jan-1946   DATE OF CONSULTATION:  11/26/2008  DATE OF DISCHARGE:                                 CONSULTATION   REASON FOR CONSULTATION:  Hematemesis.   HISTORY:  The patient is a 65 year old female who was up in the Rehab  Unit today and while in rehab vomited coffee ground and some bright red  blood.  Prior to that, she had been doing fine from a GI standpoint.   PAST MEDICAL HISTORY:  1. Diabetes.  2. Chronic renal insufficiency.  3. Nonischemic cardiomyopathy.  4. Mitral valve regurgitation with repair in 2005.  5. Anxiety.  6. Dyslipidemia.  7. History of ovarian cancer.  8. History of CHF.   ALLERGIES:  LOTENSIN.   MEDICATIONS:  Noted on chart.   REVIEW OF SYSTEMS:  Not complaining of chest pain or shortness of  breath.   PHYSICAL EXAMINATION:  GENERAL:  No acute distress.  HEENT:  Nonicteric.  HEART:  Regular rhythm.  LUNGS:  Clear.  ABDOMEN:  Soft, nontender.  No hepatosplenomegaly.   IMPRESSION:  Hematemesis.   PLAN:  The patient was started on Protonix a short while ago.  We will  proceed with EGD to evaluate for hematemesis.          ______________________________  Graylin Shiver, M.D.    SFG/MEDQ  D:  11/26/2008  T:  11/27/2008  Job:  [id number]   cc:   Erick Colace, M.D.

## 2010-09-19 NOTE — Discharge Summary (Signed)
NAMEHILLARY, STRUSS                ACCOUNT NO.:  [id number]   MEDICAL RECORD NO.:  [id number]          PATIENT TYPE:  INP   LOCATION:  3011                         FACILITY:  [facility]   PHYSICIAN:  [name], M.D.  DATE OF BIRTH:  03-22-1946   DATE OF ADMISSION:  11/06/2008  DATE OF DISCHARGE:                               DISCHARGE SUMMARY   DISCHARGE DIAGNOSES:  1. Stroke.  2. Hypertension.  3. Diabetes.  4. Hyperlipidemia.   MEDICATIONS:  1. Norvasc 10 mg by mouth daily.  2. Lipitor 60 mg by mouth daily.  3. Coreg 25 mg by mouth twice daily with meals.  4. Plavix 75 mg by mouth daily with meals.  5. NovoLog 9 units subcutaneous three times a day before meals.  6. Lantus 40 units subcutaneous at bedtime.  7. Lisinopril 60 mg by mouth daily.  8. Ritalin 5 mg by mouth daily in the morning and again at noon.  9. Nystatin cream one application topically twice daily.  10.Spironolactone 25 mg by mouth daily.  11.Niacin CR 500 mg one tablet at bedtime.   CONDITION ON DISCHARGE:  The patient is discharged in stable condition  to the inpatient rehab at [facility].  Please monitor her blood sugars  and get a repeat basic metabolic panel and CBC in a week.  Neurology may  also continue seeing this patient after discharge.   PROCEDURES PERFORMED:  Ms. Sickles had four noncontrast CT scans as well as  a CT angiogram of the head and neck.  She also received a  2-D echo.   CARDIAC ASSESSMENT:  Dr. Pearlean Brownie with neurology consulted on this patient.   HISTORY AND PHYSICAL:  Ms. Rehm is a 65 year old female with a past  medical history significant for nonischemic cardiomyopathy secondary to  alcohol, diabetes, type 2 with an A1c of 10.3, hypertension, and  hyperlipidemia that was admitted to the ED following two episodes of  dizziness for two days prior to admission.  The patient states that  before admission she was seated on the porch and stood up too quickly  which caused her to get  dizzy, but this was resolved.  On the day of  admission she had a similar episode also associated with standing.  She  did not faint or fall during either episode and did not check her blood  sugar after these episodes.  Her daughter also reports that starting the  day prior to admission she had been dragging her left foot when she  walks and has been holding her left arm rather than swinging it while  walking.  The patient initially said that she was unaware that she was  doing either of these things and felt like she was walking normally.  The patient at the time of admission denied chest pain, shortness of  breath, nausea, vomiting, shaking, diaphoresis, or any other concerning  symptom.  Of note the daughter questions her medication compliance.   PHYSICAL EXAMINATION:  VITAL SIGNS:  Temperature 98.2, pulse 69, blood  pressure 152/81, respirations 18, oxygen saturation 95% on room air.  GENERAL:  The patient was alert and oriented x3.  HEENT:  Pupils were equal and slightly dilated with minimal reactivity  to light.  Mouth:  Pharynx was pink and moist, no erythema.  NECK:  Full range of motion.  No thyromegaly.  No JVD.  LUNGS:  Normal respiratory effort.  Clear to auscultation bilaterally  with no rhonchi, wheezes, or rales.  HEART:  Normal rate and rhythm.  No murmurs, gallops, or rubs.  ABDOMEN:  Soft, nontender, nondistended.  MUSCULOSKELETAL:  No joint swelling, no joint walk.  Pulses 2+ DP/PT  pulses bilaterally.  EXTREMITIES:  No cyanosis, no clubbing, no edema.  NEUROLOGIC:  Alert and oriented x3.  Cranial nerves II-XII are intact.  Strength decreased in the left arm and the left leg.  Sensation is  intact.  SKIN:  Mild skin rash under abdominal skin fold.  PSYCHIATRIC:  Oriented x3.  Normal, interactive, good eye contact.  Not  anxious or depressed-appearing.   LABORATORY DATA:  Admission labs:  White blood cell count 10.8,  hemoglobin 12.9, hematocrit 39, platelet count  312.  Basic metabolic  panel:  Sodium 138, potassium 4.4, chloride 106, bicarb 27, BUN 9,  creatinine 1.43, glucose 287.  Bilirubin 0.6, alkaline phosphatase 87,  AST 15, ALT 10, protein 6.4, albumin 3.2, calcium 8.9.  A CT scan on  admission was done without contrast and showed:  1.  Hypoattenuation in  the corpus callosum, most prominent to the right of midline, mass lesion  involving the corpus callosum is not excluded.  MRI was suggested.  2.  Focal areas of decreased attenuation in the deep white matter in the  right frontal lobe are nonspecific, but may represent remote lacunar  infarctions.  3.  Stable 1.5 cm meningioma of the [place] cerebral region  at the vertex.   HOSPITAL COURSE:  1. Stroke.  The patient was admitted and based on noncontrast CT scan      results on admission, was thought to have either a stroke versus a      tumor near the corpus callosum.  MRI was scheduled for the next      day; however, the patient has had pacer leads and so MRI was unable      to be obtained.  A repeat noncontrast CT was gotten the day after      admission and showed the hypoattenuation area near the corpus      callosum was slightly increased in size which was more consistent      with an infarct as opposed to a mass.  Two days following admission      the patient's neurological exam acutely worsened with decreased      strength on the left leg and left arm.  Sensation remained intact.      The patient was also more somnolent and although she remained      oriented, she fell sleep numerous times during the exam.  Neurology      was consulted and they suggested a repeat noncontrast CT scan which      showed:  1.  Continued slight enlargement of an area of      hypoattenuation involving the right corpus callosum that likely      represents infarct; and 2.  Focus of hypoattenuation involving the      foci of the right frontal lobe which raises concern for      subarachnoid hemorrhage.  This  could alternatively represent sulcal  effacement.  At that time the patient was transferred to the neuro      ICU.  Given concern for subarachnoid hemorrhage, Plavix was      discontinued at that time.  The patient remained stable over the      next couple days and was transferred back to the regular floor.  A      CT angiogram of the head and neck on July 6 showed right greater      than left carotid artery atherosclerosis with no vascular stenosis      with no acute findings identified in the neck.  A CT angiogram of      the head showed various arterial occlusions including the right ACA      distal segment, right PCA, MCA, ICA.  No hemorrhage was identified      on CT angiogram.  At this point subarachnoid hemorrhage was ruled      out.  The patient was diagnosed with a worsening or second infarct      and the patient was restarted on Plavix.  The patient has remained      stable and we have started her on Ritalin 5 mg in the morning and 5      mg at noon in the hopes that she will be less somnolent and able to      tolerate rehabilitation in the inpatient rehab.  2. Diabetes.  The patient was placed on Lantus and sliding scale      insulin during admission and her blood glucose remained well-      controlled.  3. Urinary tract infection.  The patient was diagnosed with a urinary      tract infection that grew Enterobacter, sensitive to ciprofloxacin.      The patient took a 7-day course of ciprofloxacin 250 mg twice      daily.  She has completed the course of antibiotics.  4. Hypertension.  The patient was placed on her home medications.  Her      blood pressure remained within normal limits throughout      hospitalization.  5. Hyperlipidemia.  She was continued on her home medications   LABORATORY DATA:  Discharge labs and vitals:  Temperature 97.7, blood  pressure 121/56, pulse 60, respirations 20, oxygen saturation 92% on  room air.  Labs:  Basic metabolic panel:  Sodium  141, potassium 3.9,  chloride 108, bicarb 27, BUN 17, creatinine 1.53, glucose 126, calcium  9.4.  Complete blood count:  White blood cells 11.1, hemoglobin 13.1,  hematocrit 39.4, platelet count 289.      Silvestre Gunner, MD  Electronically Signed      [name], M.D.  Electronically Signed    AR/MEDQ  D:  11/11/2008  T:  11/11/2008  Job:  [id number]   cc:   Inpatient Rehab  Pramod P. Pearlean Brownie, MD

## 2010-09-19 NOTE — Discharge Summary (Signed)
[name], [name] NO.:  [id number]   MEDICAL RECORD NO.:  [id number]          PATIENT TYPE:  INP   LOCATION:  4703                         FACILITY:  [facility]   PHYSICIAN:  Acey Lav, MD  DATE OF BIRTH:  1946/05/03   DATE OF ADMISSION:  12/24/2008  DATE OF DISCHARGE:                               DISCHARGE SUMMARY   DISCHARGE DIAGNOSES:  1. Deep venous thrombosis of left lower extremity.  2. Cerebrovascular accident.  3. Urinary tract infection.  4. Upper gastrointestinal bleed in the past.  5. Diabetes mellitus.  6. Hypertension.  7. Hyperlipidemia.  8. Congestive heart failure.   DISCHARGE MEDICATIONS WITH ACCURATE DOSES:  1. Coreg 25 Mg Tabs (Carvedilol) .... Take 1 and 1/2  tablets by mouth      two times a day  2. Lantus 100 Unit/ml Soln (Insulin glargine) .... Inject 25 units      subcutaneously at bedtime  3. Lipitor 60 Mg Tabs (atorvastatin Calcium)  .... Take one tablet      daily to lower cholesterol.  4. Novolog 100 Unit/ml Soln (Insulin aspart) .... Inject 5 units into      skin of abdomen 30 minutes before each meal  5. Lisinopril 40 Mg Tabs (Lisinopril) .... Take 1 and 1/2 tabs daily  6. Spironolactone 25 Mg Tabs (Spironolactone) .... Take 1 tablet by      mouth at bedtime  7. Bd Insulin Syringe 29g X 1/2 0.5 Ml Misc (Insulin syringe-needle u-      100) .... Use as directed for both the lantus and the novolog  8. Nystatin 100000 Unit/gm Crea (Nystatin) .... Apply two times a day      to affected areas until rash is gone  9. Niacin Cr 500 Mg Cpcr (Niacin) .... Take 1 tablet by mouth at      bedtime  10.Norvasc 5 Mg Tabs (Amlodipine besylate) .... Take 1 tablet by mouth      once a day  11.Prodigy Autocode Blood Glucose Devi (Blood glucose monitoring      suppl) .... Use to check blood sugar before meals and bedtime  12.Prodigy Autocode Blood Glucose Strp (Glucose blood) .... Use to      check blood sugar before meals and bedtime  13.Prodigy Twist Top Lancets 28g Misc (Lancets) .... Use to check      blood sugar before meals and bedtime  14.Prodigy Insulin Syringe 31g X 5/16 0.5 Ml Misc (Insulin syringe-      needle u-100) .... Use to inject your insulin four times a day  15.Plavix 75 Mg Tabs (Clopidogrel bisulfate) .Marland Kitchen.. 1 tab once daily  16.Ritalin 20 Mg Tabs (Methylphenidate hcl) .... Take 1 tab at 7 am      and 1 at noon  17.Pantoprazole Sodium 40 Mg Tbec (Pantoprazole sodium) .... Take 1      tablet by mouth two times a day  18.Flomax 0.4 Mg Caps (Tamsulosin hcl) .... Take 1 tab by mouth at      bedtime  19.Urecholine 25 Mg Tabs (Bethanechol chloride) .... Take 1 tablet by  mouth three times a day  20.Lantus 100 Unit/ml Soln (Insulin glargine) .... Inject 25 units      subcutaneously at bedtime  21.Tylenol Arthritis Pain 650 Mg Cr-tabs (Acetaminophen) .... Take 1      tab every 4 hours as needed for pain  22.Senakot -S 2 tabs every night and stop if diarhoea  23.Coumadin 7.5 mg po daily   DISPOSITION AND FOLLOWUP:  The patient is to be transferred to a skilled  nursing facility on December 30, 2008.  At SNF Check PT/INR every week and  adjust dosage of Coumadin accordingly to keep INR 2-3 that is  therapeutic range for DVT.   There is a followup appointment with Dr. Polly Cobia on Friday at 2:40 p.m.,  and there is a followup appointment with Dr. Alexandria Lodge at the same time for  the Coumadin Clinic and Coumadin followup.  Dr. Polly Cobia is to follow up  for any bleed and assesment of UTI. Cancel the appointment with Dr Alexandria Lodge  if SNF has their own Pharmacy setup to follow coumadin and PT/INR.   PROCEDURES PERFORMED ON HER:  1. A chest x-ray done on December 28, 2008, suggestive of stable      cardiomegaly, no acute cardiopulmonary abnormality.  2. EKG was done on December 24, 2008.   CONSULTATIONS:  [facility]. Repeat endoscopy on her by Dr. Bernette Redbird.  The endoscopy was done on December 25, 2008, and it was  suggestive of  healed ulcer and slight residual nonerosive inflammatory changes.  According to his impression. there was resolution of previous gastric  ulcer with mild nonerosive residual inflammatory changes present.  According to his plan, no further endoscopic evaluation was needed and  was okay for oral anticoagulation to be followed up for deep venous  thrombosis.   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  He presented to the ED  for left calf pain and swelling that first began 1 week prior to  admission.  The patient's nurse at rehab facility initially felt that  left leg pain may have been due to recent physical therapy, but her pain  did get some better for a few days.  Four days prior to admission, the  patient reports that the pain had returned and describes the pain to be  behind her left knee.  It was burning in sensation in nature with  swelling of the extremity.  Left lower extremity venous ultrasound was  done at the rehab facility near [facility] and showed DVT of  the left popliteal vein and proximal calcification of the vein.  The  patient denies fever, chills, shortness of breath, chest pain, cough,  abdominal pain, nausea, vomiting, diarrhea or any other bowel  complaints.   PATIENT'S PHYSICAL EXAMINATION AT THE TIME OF PRESENTATION:  VITAL  SIGNS:  Temperature 98.7, pulse 74, blood pressure 134/77, respiratory  rate 18, oxygen saturation 99% on room air.  GENERAL:  Alert female with a very flat affect and no acute distress.  HEENT:  Pupils  equal, round, reactive to light.  Ears, nose, and throat  examination notable for missing teeth.  Moist oral mucosa.  NECK:  Supple without JVD or lymphadenopathy.  No carotid bruit.  BREASTS:  Breast tissue was dense but equal.  No masses were palpated  and no nipple dis.  No axillary lymphadenopathy.  CHEST:  Clear to auscultation bilaterally without wheezes, rales,  rhonchi, or crackles.  HEART:  Regular rate and rhythm without murmur, rubs, or gallops.  ABDOMEN:  Soft, obese, nontender, nondistended with positive bowel  sounds in all 4 quadrants.  EXTREMITIES:  Swelling noted of the left calf, more prominent behind the  knee as compared to the right.  Left calf is warm to touch,  nonerythematous.  Pulses are 2+ bilaterally.  NEUROLOGICAL:  Speech was slightly dysarthric.  She had definite  difficulty with sequencing awareness initiation, oriented to time,  place, and person.  She is able to follow 1-step command generally  without difficulty.  She was able to state that she was in rehab  hospital at [facility].  She was not able to move the left leg on command.  A  3/5 strength in left upper extremity, 5/5 strength right upper and right  lower extremities.  Slight left facial droop noted.  PSYCH:  Flat affect and mood was euthymic.   HOSPITAL COURSE:  1. For DVT, we were not able to start her on Lovenox and Coumadin      initially given her past medical history of upper GI bleed in July      2010.  There was a 6-mm antral ulcer noted during that admission on      endoscopy.  We started her on heparin, and GI was consulted for      reevaluation of the upper GI bleed.  Dr. Matthias Hughs did upper GI      endoscopy on December 25, 2008, and he said that the antral ulcer has      healed and it is safe for [place] to continue with anticoagulation for      DVT resolution, so we started her on Coumadin on December 25, 2008,      and the patient was in therapeutic PT/INR level on day #4 of      admission on  December 28, 2008.  She did not complain of any pain in      her left leg during the admission and it seems that the pain meds      we were giving were helping her.  The DVT in her leg was most      likely due to recent immobilization and hospitalization for stroke,      but it could also be due to malignancy because it was noted during      the last colonoscopy that was in January 2010, that she had a      tubular adenoma and hyperplastic polyp.  2. History of CVA.  It has been improving and we consulted PT to      evaluate her function in the left leg and left forearm and they      said that the patient has been recovering very well and she would      probably be better off if we transfer her to a rehab facility or a      skilled nursing facility with physiotherapy privileges so that the      function can be gained further.  3. Urinary tract infection.  Urinalysis showed moderate leukocytes and      the patient was on Macrobid, so we continued the  Macrobid during      the hospital stay and the urine culture was done and it showed no      growth as a final result.  So, Macrobid was discontinued on      discharge.  4. For her upper GI bleed, as I already discussed it in problem #1, a      repeat endoscopy was done, which was suggestive of healed ulcer, so      a further workup for an upper GI bleed is not required, but we did      continue with Protonix 40 mg 2 times a day.  5. For her diabetes, we continued the home meds of Lantus 20 units at      bedtime and sliding scale insulin and she was well controlled with      that.  6. For hypertension, we continued her home meds, but we discontinued      the labetalol and started her on lisinopril instead because she did      not need 2 beta-blockers at the same time.  7. For hyperlipidemia, we continued her home meds.  8. For congestive heart failure, we continued her home meds and there      was no evidence of any volume overload during the  hospital course      of stay.   DISCHARGE LABORATORY DATA:  WBC 10.5, hemoglobin 12.9, hematocrit 38.3,  platelets 202.  Prothrombin time 39.0, INR 4.1.   DISCHARGE VITAL SIGNS:  Temperature 97.9, pulse 78, respirations 14 per  minute, systolic blood pressure 125, diastolic blood pressure 76, oxygen  saturation 98% on room air.      Peggy Mage, MD  Electronically Signed      Acey Lav, MD  Electronically Signed    AG/MEDQ  D:  12/29/2008  T:  12/30/2008  Job:  [id number]   cc:   Outpatient Clinic  Ardeth Sportsman, MD

## 2010-09-19 NOTE — Op Note (Signed)
[name], [name] NO.:  [id number]   MEDICAL RECORD NO.:  [id number]          PATIENT TYPE:  IPS   LOCATION:  4035                         FACILITY:  [facility]   PHYSICIAN:  Graylin Shiver, M.D.   DATE OF BIRTH:  [birth year]   DATE OF PROCEDURE:  11/26/2008  DATE OF DISCHARGE:                               OPERATIVE REPORT   SURGEON:  Graylin Shiver, MD   PROCEDURE:  Esophagogastroduodenoscopy with epinephrine injection.   INDICATIONS FOR PROCEDURE:  Hematemesis which occurred today while the  patient was up in rehab.   Informed consent was obtained after explanation of the risks of  bleeding, infection, and perforation.   PREMEDICATION:  1. Fentanyl 25 mcg IV.  2. Versed 3 mg IV.   PROCEDURE:  With the patient in the left lateral decubitus position, the  Pentax gastroscope was inserted into the oropharynx and passed into the  esophagus.  It was advanced down the esophagus then into the stomach and  into the duodenum.  The second portion of the duodenum looked normal.  The bulb of the duodenum showed some erythematous mucosa compatible with  duodenitis.  The stomach revealed a 6-mm antral ulcer with a reddish  base.  The base was washed, and I did not specifically see a visible  vessel, but the base of the ulcer was rather diffusely reddish.  This  cleared up a little bit with washing and there was then some patchy  redness on the crater of this small ulcer.  The surrounding mucosa was  edematous appearing.  There was no active bleeding during this  examination.  Because of the recent hematemesis and reddish base of this  ulcer, I went ahead and injected 2 mL of 1:10,000 epinephrine around the  ulcer crater.  The rest of the stomach looked normal.  The esophagus  looked normal.  She tolerated the procedure well without complications.   IMPRESSION:  A 6-mm antral ulcer with a reddish base.  The surrounding  mucosa looked edematous.  There was no evidence of  active bleeding on  this exam.  A 2 mL of 1:10,000 epinephrine was injected.   I would recommend treatment with proton pump inhibitors and following  her clinically.  I think it would be a reasonable thing to re-endoscope  her in a few weeks to first of all determine if this ulcer has healed  and second of all to see if this surrounding edematous tissue is still  present and it is so, would take some biopsies of it just to make sure  nothing else is going on in this area.           ______________________________  Graylin Shiver, M.D.    SFG/MEDQ  D:  11/26/2008  T:  11/27/2008  Job:  [id number]   cc:   Erick Colace, M.D.

## 2010-09-19 NOTE — Discharge Summary (Signed)
[name], [name]                ACCOUNT NO.:  [id number]   MEDICAL RECORD NO.:  [id number]          PATIENT TYPE:  IPS   LOCATION:  4035                         FACILITY:  [facility]   PHYSICIAN:  Erick Colace, M.D.DATE OF BIRTH:  May 23, 1945   DATE OF ADMISSION:  11/12/2008  DATE OF DISCHARGE:  12/15/2008                               DISCHARGE SUMMARY   DISCHARGE DIAGNOSES:  1. Right cerebrovascular accident with left hemiparesis.  2. Hypertension.  3. Diabetes mellitus type 2.  4. Urinary retention.  5. Urinary tract infection, treated.  6. Gastritis with gastrointestinal bleed, stable.   HISTORY OF PRESENT ILLNESS:  [name] is a 65 year old female with  history of diabetes mellitus, nonischemic cardiomyopathy admitted November 06, 2008 with dizziness and falls with left-sided weakness.  CT of head  done showed right frontal lobe and right corpus callosum infarct.  Carotid Doppler showed no ICA stenosis.  A 2-D echo done showed EF of  40%-45% with diffuse hypokinesis and no source of emboli.  Carotid  Dopplers done showed no ICA stenosis.  The patient was noted to have  Enterobacter UTI and was treated with Cipro x3 days.  CTA of neck showed  right greater than left carotid artery atherosclerosis without stenosis  and right ACA occlusion and distal right A2 segment with moderate to  severe tandem stenosis left A2 segment and moderate to severe  atherosclerosis bilateral MCA, stable right ACA infarct without  hemorrhage.  Initial CT of head showed question of pseudosubarachnoid  hemorrhage, however, no bleed seen on CTA and neuro recommends changing  the patient back to Plavix for CVA prophylaxis.  The patient was noted  to have issues with endurance as well as flat affect and poor initiation  and was started on Ritalin.  Therapies were initiated.  The patient was  noted be slow to process with poor awareness of deficits.  She continues  with left hemiparesis, left facial  weakness.  She was noted to have mild  oropharyngeal dysphagia and is on regular diet of thin liquids.  The  patient was evaluated by rehab and felt to be a good candidate for CIR  program.   PAST MEDICAL HISTORY:  Significant for DM type 2, chronic renal  insufficiency and __________with permanent pacemaker, MVR repair in  2005, anxiety disorder, dyslipidemia, history of ovarian cancer with  hysterectomy in the 1980s, history of noncompliance with medications and  history of CHF.   ALLERGIES:  LOTENSIN.   FAMILY HISTORY:  Positive for CVA.   SOCIAL HISTORY:  The patient is married but separated, lives alone in  one-level home with three steps at entry.  Uses 2-3 years cigarettes a  day.  Has a history of alcohol abuse in the past and does not use any  alcohol currently.   FUNCTIONAL HISTORY:  The patient was independent prior to admission.   FUNCTIONAL STATUS:  The patient is total assist 50% for posture, total  assist 60% to transfer to chair, unable to advance bilateral lower  extremity.  She is requiring min assist for grooming with perseveration.  Tends to fall to the left when seated.   PHYSICAL EXAMINATION:  VITALS:  Blood pressure 132/84, pulse 69,  respiratory rate 22, temperature 97.1.  GENERAL:  The patient is alert female with very flat affect.  HEENT:  Pupils equal, round, react to light.  Ears, nose and throat exam  notable for missing teeth but moist oral mucosa noted.  NECK:  Supple without JVD or lymphadenopathy.  CHEST:  Clear to auscultation bilaterally without wheezes, rales or  rhonchi.  HEART:  Shows regular rate rhythm without murmurs, gallops or rubs.  EXTREMITIES:  Showed no evidence of clubbing, cyanosis or edema.  ABDOMEN:  Soft, nontender with positive bowel sounds.  NEUROLOGIC:  Cranial nerves II-XII show mild left central VII, speech  slightly dysarthric.  Reflexes are 1+ throughout.  Sensation is perhaps  decreased to pinprick left arm and left  leg.  There is no overt left  hemi-inattention.  She tracked to all fields.  She did have occasional  problems with word finding.  She has definite difficulties with  sequencing, awareness, initiation.  Memory is poor.  No hallucinations.  She is able to follow one-step command generally without difficulty.  She was able to state that she was in rehab hospital at [facility].  Strength  is 4/5 left upper extremity, 3/5 left lower.  She has 5/5 strength in  right upper extremity, 4/5 right lower.  No clonus seen in ankles.  Toes  are nonreactive.   HOSPITAL COURSE:  [name] was admitted to rehab on November 12, 2008  for inpatient therapies to consist of PT, OT and speech therapy at least  3 hours 5 days a week.  Past admission physiatrist, rehab RN and therapy  team have worked together to provide customized collaborative  interdisciplinary care.  Rehab RN has been working with the patient on  safety awareness with plan for fall prevention due to the patient's poor  insight.  They have also been working with the patient by closely  monitoring the patient's p.o. intake and offering supplements as needed.  Initially the patient was noted to have issues with poor endurance.  Therapies were spread out with rest breaks in between.  Ritalin was  continued to help with initiation and the dose has currently been  titrated to 20 mg p.o. at 7:00 a.m. and 12 noon with much improvement in  her energy levels as well as attention and ability to consistently  follow through with commands.  The patient's blood pressures have been  monitored on b.i.d. basis.  These have shown reasonable control.  She  was initially on Lotensin as well as spironolactone, however, was noted  to have worsening in her renal status in part due to poor p.o. intake as  well as ACE and diuretics.  The patient's BUN and creatinine were noted  to elevate to 39 and 2.16 and she was started on IV fluids to help with  hydration.  Due to  the patient's poor endurance as well as lethargy,  initially diet was downgraded to D2.  As the patient's mentation and  oral motor control has improved, she has been advanced to a D3 diet with  improvement in her p.o. intake.  IV fluids were discontinued on November 29, 2008.  Check of electrolytes from December 15, 2008 revealed sodium 138,  potassium 3.9, chloride 103, CO2 28, BUN 4, creatinine 1.62, glucose 97.  In the past 24 hours, the patient's blood pressures have ranged from  120s to 150s systolics 60s to 70s diastolic.  Heart rate has been in 70s  to 80s range.  Last weight is at 99 kg.   The patient was started on toileting program by rehab RN with attempts  to help the patient with incontinence.  The patient was noted to have  issues with retention with PVRs checked at 300 to 400 mL.  A UC was  rechecked.  The patient was noted to have recurrent UTI with urine  culture growing out enterococcus.  The patient was treated with 7 days  amoxicillin for this.  Due to issues with urinary retention, the patient  was started on Flomax as well as Urecholine.  As the patient started  voiding, these were discontinued; however, the patient was noted to have  recurrent issues with urinary retention requiring in-and-out  catheterization.  Flomax as well as Urecholine has been resumed.  The  patient is voiding on b.i.d. to t.i.d. basis with PVRs continued at 56  to 375 mL.  Recommendations are to continue monitoring the patient's  voiding.  If the patient has not voided in 8-12 hours, in-and-out  catheterization.  Repeat urine culture done of December 13, 2008 has shown  no growth.   On November 26, 2008, the patient was noted to have an episode of gross  hematemesis with blood clots noted in vomitus.  GI was consulted for  input and the patient was noted to have 6-mm antral ulcer with base of  ulcer red and surrounding mucosa to be edematous.  This was injected  with 2 mL of 1:10,000 epinephrine by  Dr. Evette Cristal.  Recommendations are to  resume resumed EGD in 4 weeks.  The patient was continued on a PPI at  increased dosage.  The patient's aspirin and Plavix were placed on hold.  Her H and H were monitored closely.  She was noted to drop in her  hemoglobin to 8.5 on November 27, 2008 and transfused with 2 units packed  red blood cells.  Daily CBCs have been done with H and H showing  stability and therefore Plavix was resumed on November 30, 2008 per GI  input.  Most recent CBC from December 13, 2008 reveals hemoglobin 11.2,  hematocrit 31.9, white count 8.3, platelets 293.  Recommend routine  monitoring of CBC.  Repeat EGD is recommended in 4 weeks by Dr. Evette Cristal.   At the time of admission, the patient was noted to be limited by left-  sided weakness and decreased range of motion, mild left inattention and  noted to be mildly impulsive.  She was also noted to have poor  awareness, decrease in balance transfers as well as poor awareness of  her deficits.  The patient was exhibiting cognitive deficits and  limitations affecting her ability to identify areas with need for  improvement due to her CVA.  She was noted to have delay in processing  most information, however, continued to exhibit some impulsivity in  performing activities.  Working memory was poor.  She required mod to  max cues for problem solving and reasoning.  OT evaluation was done at  edge of bed with focus on sustained attention and basic orientation,  visual scanning and tracking especially to the left and improving  awareness to left upper extremity to improve weightbearing through left  upper extremity while reaching with right.  The patient was max to total  assist to sit at the edge of bed for self-care tasks with max verbal  cues.  She was impulsive at times.  The patient's awareness and  alertness has improved.  She is showing improvement in standing balance,  improvement in standing endurance and increased use of left  upper  extremity for support during ADLs.  She is showing improvement in  initiation and sustaining attention to the ADLs __________, however,  continues to require min assist for bathing, min to mod assist for  dressing, min assist for toileting transfers.  She continues to be  hyperverbal with confabulated stories.  PT evaluation revealed the  patient is mod to max assist for sit to stand with max cuing for safety  and attention.  She was able to stand in parallel bars x5 with mod to  max assist with hand-over-hand assist to use left upper extremity.  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  Physical therapy has been working with the patient on  balance and coordination with the patient currently showing ability to  propel wheelchair with right hemi-technique for 125 feet with  supervision with mod verbal cues for attention to task and attention to  obstacles on left side.  She is able to ambulate 25 feet with the right  hallway with min assist with emphasis on increasing speech as well as  reciprocal positioning and initiation of left swing.  She requires mod  cues not to stray from topic and continues to be very distractible.  Speech therapy at has been working with the patient to attend to speaker  as well as left visual field.  They are working on the patient's  intellectual awareness of medical diagnosis as well as increasing basic  verbal and functional problem solving.  She has been advanced to D3  diet, thin liquids and is tolerating this, requiring occasional cuing to  clear oral cavity with minimal signs or symptoms of aspiration.  She  continues to require max assist for basic problem solving as well as  counting and identifying simple coinage.  She continues with decrease  verbalization and responses to questions regarding activity.  The  patient requires  24-hour supervision and assistance which family is  unable to provide.  Search for SNF was initiated and bed is available  today and the patient is to be discharged from this facility with  progressive PT, OT, speech therapy to continue past discharge.   DISCHARGE MEDICATIONS:  1. Lipitor 60 mg q.h.s.  2. Senokot-S 2 p.o. q.h.s.  3. Nystatin powder to skin folds b.i.d.  4. Coreg 25 mg b.i.d.  5. Niacin 500 mg q.h.s.  6. NovoLog insulin 5 units t.i.d. with meals.  7. Norvasc 5 mg p.o. per day.  8. Senokot-S 2 p.o. at noon.  9. Plavix 75 mg a day.  10.Protonix 40 mg b.i.d.  11.Flomax 0.4 mg q.h.s.  12.Urecholine 25 mg t.i.d.  13.Ritalin 20 mg at 7:00 a.m. and at noon.  14.Lantus insulin 33 units q.h.s.  15.Labetalol 100 mg b.i.d.  16.Resource supplements t.i.d.  17.Sorbitol 30 mL b.i.d. p.r.n. constipation.  18.Tylenol 650 mg q.4 hours p.r.n. pain.   DIET:  Carbohydrate-modified medium, D3, thin liquids with supervision.   ACTIVITY LEVEL:  24-hour supervision assistance.   SPECIAL INSTRUCTIONS:  Progressive PT, OT, speech therapy to continue  past discharge.  Continue monitoring and cuing for safety and fall  prevention and continue encouraging p.o. intake.  Continue monitoring  voiding with in-and-out catheterization if no  void in 8-12 hours or PVRs  greater than 350, toilet patient q.4 h while awake, offer nutritional  supplements routinely, routine check of electrolytes to monitor renal  status and H and H for stability of hemoglobin.   FOLLOW UP:  1. The patient to follow up with Dr. Evette Cristal, GI, in the next few weeks      for repeat EGD.  2. Follow up with Dr. Wynn Banker in 4 weeks.  3. Follow up with LMD for medical issues.      Greg Cutter, P.A.      Erick Colace, M.D.  Electronically Signed    PP/MEDQ  D:  12/15/2008  T:  12/15/2008  Job:  [id number]   cc:   Levert Feinstein, MD  [name], M.D.  Graylin Shiver, M.D.

## 2010-09-19 NOTE — Consult Note (Signed)
[name], [name] NO.:  [id number]   MEDICAL RECORD NO.:  [id number]          PATIENT TYPE:  OBV   LOCATION:  3103                         FACILITY:  [facility]   PHYSICIAN:  Levert Feinstein, MD          DATE OF BIRTH:  December 11, 1945   DATE OF CONSULTATION:  DATE OF DISCHARGE:                                 CONSULTATION   REFERRING PHYSICIAN:  [name], MD   CHIEF COMPLAINT:  Stroke.   HISTORY OF PRESENT ILLNESS:  The patient is a 65 year old right-handed  African American female.  She has a past medical history of nonischemic  cardiomyopathy, secondary to alcohol abuse, diabetes, hypertension,  status post mitral valve annuloplasty, and placement of left ventricular  epicardial pacemaker in November 2005, also with past medical history of  hyperlipidemia, smoking, obesity, noncompliant with the medications,  presenting with acute left leg more than arm weakness on Thursday, November 04, 2008 at 1 p.m.   She finished lunch with her neighbor at her porch on November 04, 2008 around  1 o'clock.  When she got up, she noticed dizziness and lightheadedness  sensation.  At the same time, she had unsteady gait, staggering because  of left leg weakness.  Next day, the family also noted she has  dysarthria, decreased arm swing on her left side.  She denies sensory  change.  Overall, she has improved.   Upon admission, CT of the brain had demonstrated evolving lesion at  right genu of corpus callosum, and also right frontal subcortical white  matter, has gradual maturation over those three consecutive daily scans.   She is not a candidate for MRI of the brain due to previous pacemaker  leads placement.  She also has chronic renal insufficiency with  creatinine 1.5, GFR of 43, not a candidate for IV contrast dye.   Overall, she has improved.  Denied chest pain and denied visual change.   REVIEW OF SYSTEMS:  Pertinent as above.   PAST MEDICAL HISTORY:  1. Obesity.  2.  Hypertension.  3. Hyperlipidemia.  4. Diabetes, insulin dependent.  5. Previous alcohol abuse.  6. Active smoker.  7. Noncompliance with medications.   PAST SURGICAL HISTORY:  Mitral valve regurgitation with dilated  cardiomyopathy, status post mitral valve annuloplasty and replacement of  left ventricular epicardial pacemaker in November 2005, history of  congestive heart failure with baseline ejection fraction of 20-25%, and  remote history of ovarian cancer status post surgery.   FAMILY HISTORY:  Noncontributory.   SOCIAL HISTORY:  She lives independently.  Has mild memory loss, but  independent on her daily activity.  Actively smoking.  Denies alcohol.  Noncompliant with medications.   ALLERGIES:  LOTENSIN.   CURRENT MEDICATIONS:  1. Norvasc 10 mg once everyday.  2. Aspirin 325 mg.  3. Lipitor 60 mg everyday.  4. Coreg 25 mg twice a day.  5. Cipro 250 mg twice a day.  6. Insulin.  7. Lisinopril 60 mg everyday.  8. Niacin b.i.d.  9. Aldactone 25 mg everyday.   PHYSICAL EXAMINATION:  VITAL SIGNS:  Temperature 98.4, blood pressure  146/85, heart rate of 85, respirations 20.  CARDIAC:  Regular rate.  NECK:  Supple.  No carotid bruits.  NEUROLOGICAL:  She is drowsy but mild dysarthria in dentured speech, but  oriented to time and place.  Cranial nerves II-XII.  Pupil equal, round,  reactive to light.  Extraocular movements were full.  Visual fields were  full on confrontational test.  Facial sensation was normal.  There was  left lower face weakness.  Uvula and tongue midline.  Head turning,  shoulder shrugging normal and symmetric.  MOTOR:  Normal tone and there was mild left upper extremity weakness 5-,  moderate left lower extremity weakness 3+ and there was no sensory  changes.  Deep tendon reflex normal and symmetric.  Plantar responses  were flexor.  Gait was deferred.  There was no dysmetria.   CT of the head without contrast on November 06, 2008, November 07, 2008, and  November 08, 2008 noticed, there was hypodensity lesion involving right corpus  callosum genu, also extending to the right frontal subcortical white  matter, in the right ACA distribution.   ASSESSMENT AND PLAN:  A 65 year old right-handed African American  female, with acute right anterior cerebral artery territory infarction  detailed above, vascular risk factor of obesity, cardiomyopathy,  congestive heart failure, status post mitral valve annuloplasty surgery,  hyperlipidemia, hypertension, smoker, and noncompliant with medications.  1. She is not a candidate for MRI due to pacemaker placement.  2. Not a candidate for CT angiogram either due to chronic renal      insufficiency.  3. Complete evaluation with ultrasound of carotid artery and      echocardiogram.  4. change aspirin to Plavix.  She will benefit PT/OT.      Levert Feinstein, MD  Electronically Signed     YY/MEDQ  D:  11/08/2008  T:  11/09/2008  Job:  [id number]

## 2010-09-19 NOTE — Op Note (Signed)
[name], [name]                ACCOUNT NO.:  [id number]   MEDICAL RECORD NO.:  [id number]          PATIENT TYPE:  INP   LOCATION:  4703                         FACILITY:  [facility]   PHYSICIAN:  Bernette Redbird, M.D.   DATE OF BIRTH:  1945-07-09   DATE OF PROCEDURE:  12/25/2008  DATE OF DISCHARGE:                               OPERATIVE REPORT   PROCEDURE:  Upper endoscopy.   INDICATIONS:  A 65 year old female approximately 6 weeks status post an  upper GI bleed attributed to an endoscopically confirmed 6-mm antral  ulcer.  In the meantime, the patient has developed DVTs and is being  anticoagulated with heparin.  Prior to putting her on long-term oral  anticoagulation therapy with Coumadin, endoscopic confirmation of  healing of the ulcer was requested.   FINDINGS:  Ulcer is healed.  Slight residual nonerosive inflammatory  change present.   PROCEDURE:  The nature, purpose, and risks of the procedure had been  reviewed with the patient after she was brought from her hospital room  to the endoscopy unit.  Consent had already been provided on the  patient's behalf because of intermittent confusion, although the patient  seemed quite lucid at the time I evaluated her.  Sedation was fentanyl  45 mcg and Versed 3 mg IV without clinical instability.  The Pentax  video endoscope was passed under direct vision.  The larynx looked  normal.  The esophagus was easily entered and had normal mucosa without  evidence of reflux esophagitis, Barrett's esophagus, varices infection,  neoplasia or any ring stricture, hiatal hernia, or Mallory-Weiss tear.   The stomach was entered.  It contained no blood or coffee-ground  material.  A little bit of bile was present.  In the antral region along  the lesser curve aspect of the stomach was an area of mucosal edema and  erythema with a slit-like defect in the center probably corresponding to  the site of the previous ulcer.  There was no longer any  eschar present  implying that the lesion had epithelialized over and that the current  abnormality was simply residual inflammation without erosive changes.  The remainder of the stomach was normal including a retroflexed view of  the cardia, and the pylorus, duodenal bulb, and second duodenum were  unremarkable.   The scope was removed from the patient who tolerated the procedure well  and there were no apparent complications.  No biopsies were obtained.   IMPRESSION:  Resolution of previous gastric ulcer with just some mild  nonerosive residual inflammatory changes present.   PLAN:  No further endoscopic evaluation needed.  Okay for oral  anticoagulation.           ______________________________  Bernette Redbird, M.D.     RB/MEDQ  D:  12/25/2008  T:  12/26/2008  Job:  [id number]   cc:   [facility]  Silvestre Gunner, MD

## 2010-09-22 NOTE — Cardiovascular Report (Signed)
NAMEKATRICIA, PREHN                ACCOUNT NO.:  [id number]   MEDICAL RECORD NO.:  [id number]          PATIENT TYPE:  OIB   LOCATION:  2899                         FACILITY:  [facility]   PHYSICIAN:  Cristy Hilts. Jacinto Halim, M.D.     DATE OF BIRTH:  04-09-1946   DATE OF PROCEDURE:  02/03/2004  DATE OF DISCHARGE:                              CARDIAC CATHETERIZATION   PROCEDURE PERFORMED:  Right and left heart catheterization.   INDICATION:  Ms. Rayaan Lorah is a 65 year old African-American female with  history of nonischemic dilated cardiac myopathy with moderately severe  mitral regurgitation.  She has noticed increasing shortness of breath over  the last six months.  She is being evaluated for possible mitral valve  repair for moderately severe mitral regurgitation which was evident by  transthoracic echocardiogram. Given this, she was brought to the cardiac  catheterization lab to evaluate coronary anatomy and also to evaluate her  mitral regurgitation.   RIGHT HEART CATHETERIZATION HEMODYNAMICS:  The RA pressures were 21/17 with  a mean of 15 mmHg.  The PA pressures were 46/20 with mean of 34 mmHg.  The  pulmonary capillary wedge was 22/18 with a mean of 18 mmHg.  There was no  significant V wave noted in the pulmonary capillary wedge tracing.  The RV  pressures were 48/2 with end-diastolic pressure of 17 mmHg.   Aortic saturation 94%, PA saturation 57%, RA saturation 60%.  Cardiac output  of 2.9, cardiac index of 1.4.   ANGIOGRAPHIC DATA:  Left ventricle:  Left ventricle was mild to moderately  dilated with moderate global hypokinesis with ejection fraction estimated  around 35%.  There was mild mitral regurgitation.   Right coronary artery:  The right coronary artery is a nondominant vessel.  It has mild, noncritical coronary artery disease.   Left main coronary artery:  Left main coronary artery is a large caliber  vessel. It is normal.   Circumflex coronary artery:  Circumflex  coronary artery is a large caliber  vessel.  It continues as a large obtuse marginal one after giving AV groove  branch which gives origin to PDA.  The circumflex has mild luminal  irregularity.   Left anterior descending artery:  Left anterior descending artery is a large  caliber vessel.  It gives origin to small to moderate size diagonal one,  diagonal two.  There is mild luminal irregularity of the mid to distal LAD.   IMPRESSION:  1.  Nonischemic dilated cardiomyopathy.  Ejection fraction of 35%.  Mild      mitral regurgitation.  2.  Moderate to markedly decreased cardiac output and cardiac index.      Cardiac output of 2.9, cardiac index of 1.4.   RECOMMENDATIONS:  Based on the angiographic data, there is a big discrepancy  between moderately severe mitral regurgitation by 2-D echocardiogram and  cardiac catheterization.  Given the fact that the hemodynamics on the right  side does not suggest significant mitral regurgitation as evident by abscess  of giant V waves and also the angiogram does not reveal significant mitral  regurgitation.  The  patient will probably need either a repeat transthoracic  echocardiogram if the images are good or a TEE to further delineate her  mitral regurgitation.  Previous studies were done in August 2005.   TECHNIQUE OF PROCEDURE:  Under usual sterile precautions, using a 6 French  right femoral arterial and a 7 French right femoral venous access, left and  right heart catheterization was performed through the respective sheaths.  The right heart catheterization was performed using a Swan-Ganz catheter  which was easily advanced through the inferior vena cava, right atrium,  right ventricle and into the pulmonary artery, and pulmonary capillary wedge  was easily obtained.  Hemodynamics and cardiac outputs were measured by  Fick.  Then, the catheter was pulled out of the body in the usual fashion.   A 6 [place] multipurpose P2 catheter was advanced  into the ascending aorta  over a 0.035-inch J wire.  The catheter was then gently advanced to the left  ventricle and left ventricular pressures were monitored.  Hand contrast  injection of the left ventricle was performed both in the RAO projection.  The catheter was flushed with saline and pulled back into the ascending  aorta and pressure gradient across the aortic valve was monitored.  The  right coronary artery was selectively engaged and angiography was performed.  In a similar fashion, left main coronary was selectively engaged and  angiography was performed.  hen, the catheter was pulled out of the body in  usual fashion, and a 6 French angled pigtail catheter was utilized and was  advanced into the left ventricle in the usual fashion over the J wire, and  left ventriculography was performed injecting 36 mL of contrast at 13 mL per  second with rate of rise of 0.5.  Then, the catheter was pulled out of the  body in the usual fashion.  The patient tolerated the procedure well.  Total  of approximately 100 mL of contrast was utilized for angiography.  Right  femoral angiography was performed through the arterial access sheath and the  access was attempted to close with Perclose, but because of failure of the  device, manual pressure was held and adequate hemostasis was obtained.  The  patient tolerated the procedure well.  No immediate complications noted.       JRG/MEDQ  D:  02/03/2004  T:  02/03/2004  Job:  [id number]   cc:   Salvatore Decent. Cornelius Moras, M.D.  [address]  [place]  [place] [address]

## 2010-09-22 NOTE — Discharge Summary (Signed)
NAMEMarland Kitchen  Graham, [name]                          ACCOUNT NO.:  [id number]   MEDICAL RECORD NO.:  [id number]                   PATIENT TYPE:  INP   LOCATION:  2016                                 FACILITY:  [facility]   PHYSICIAN:  Ileana Roup, M.D.               DATE OF BIRTH:  12-07-1945   DATE OF ADMISSION:  12/04/2003  DATE OF DISCHARGE:  12/06/2003                                 DISCHARGE SUMMARY   ATTENDING PHYSICIAN:  Ileana Roup, M.D.   DISCHARGE DIAGNOSES:  1. Non-ischemic cardiomyopathy.  2. Diabetes, insulin-dependent.  3. Hypertension.  4. Hyperlipidemia.  5. Tobacco abuse.   DISCHARGE MEDICATIONS:  1. Aspirin 325 mg daily.  2. Digoxin 0.25 mg daily.  3. Hydrochlorothiazide 25 mg daily.  4. Insulin 70/30, 20 units q.a.m., 28 units at night.  5. Lisinopril 40 mg daily.  6. Lopressor 100 mg b.i.d.  7. Zocor 40 mg daily.  8. Spironolactone 75 mg daily.   FOLLOWUP:  1. The patient is to follow up with cardiology, Dr. Cristy Hilts. Ganji, whom she     has already seen in the past, on December 21, 2003, at 9 a.m.  See below.  2. She is to follow up with her primary care physician, Dr. Donald Pore in     the outpatient clinic on December 31, 2003.  On the outpatient followup,     the patient is to be seen to follow up on the recommendations of her     cardiologist, as well as to check on the results of her 2-D     echocardiogram.  The reading of the 2-D echocardiogram was pending as of     discharge, but was okay to discharge, according to cardiology.  3. The patient's diabetic regimen was also changed, so need to follow how     the patient's blood sugars are doing.   PROCEDURE:  A 2-D echocardiogram was performed on December 06, 2003.   CONSULTATIONS:  Was obtained with [facility], whom the patient  has seen previously as an outpatient.   HISTORY OF PRESENT ILLNESS:  The patient is a 65 year old African-American  female with a past medical history of  hypertension, non-ischemic cardiac  disease, hyperlipidemia, diabetes, with an ejection fraction of 25% on  echocardiogram in 2003.  She presented to the emergency room with chest pain  and heart fluttering.  She was admitted and ruled out for a myocardial  infarction.  She had some increase in her troponin from 0.12 to 0.15, and  then back to 0.1, but no increase in the CK-MB.  Cardiology felt that the  patient's chest pain was nonischemic in nature.  She was evaluated with a 2-  D echocardiogram, to follow up as an outpatient.   HOSPITAL COURSE:  #1 - CHEST PAIN, HISTORY OF NON-ISCHEMIC CARDIOMYOPATHY:  The patient was ruled out for a myocardial infarction, and seen by  cardiology.  Again, the patient's pain was felt to be non-ischemic in  nature.  The patient had a 2-D echocardiogram that was done on December 06, 2003, and she was discharged for outpatient followup on the 2-D  echocardiogram with cardiology on December 21, 2003, Dr. Jacinto Halim.  #2 - THE PATIENT HAD A SLIGHT INCREASE IN WHITE COUNT ON ADMISSION OF AROUND  13:  This trended down to 11 on the day of discharge, with no evidence of  source of infection.  A urinalysis was clear.  A chest x-ray was clear.  The  patient had no complaints of infectious symptomatology.  #3 - DIABETES:  The patient had poor control on her home regimen in the  hospital.  The patient's regimen was increased from 26 units of 70/30  insulin in the morning to 28 units of 70/30 insulin  in the morning, and  from 18 units of 70/30 insulin in the evening to 20 units of 70/30 insulin.  The patient was noted to have an elevated hemoglobin A1c of 10.1.  Will need  better sugar control as an outpatient.  #4 - HYPERTENSION:  Per cardiology's recommendation, the patient's  Lisinopril was increased from 20 mg daily to 40 mg daily.  The patient was  kept on all other home blood pressure medicines, including Metoprolol,  Spironolactone and hydrochlorothiazide.    DISCHARGE LABORATORY DATA:  On the day of discharge the patient had a white  count of 11.2, again down from 13 on admission, hemoglobin 12.4, platelet  count of 321.  Hemoglobin A1c on December 05, 2003, was 10.1.  Fasting lipid  profile:  Total cholesterol 175, triglyceride 235, HDL 27, LDL 101.      Darrol Jump, MD                           Ileana Roup, M.D.    SD/MEDQ  D:  12/06/2003  T:  12/06/2003  Job:  [id number]   cc:   Donald Pore, MD  Internal Medicine Resident - [facility]  [place]  [place] [address]  Fax: [phone]

## 2010-09-22 NOTE — Discharge Summary (Signed)
NAMEROSANN, GORUM NO.:  [id number]   MEDICAL RECORD NO.:  [id number]          PATIENT TYPE:  INP   LOCATION:  2039                         FACILITY:  [facility]   PHYSICIAN:  Salvatore Decent. Cornelius Moras, M.D. DATE OF BIRTH:  December 13, 1945   DATE OF ADMISSION:  03/21/2004  DATE OF DISCHARGE:  03/30/2004                                 DISCHARGE SUMMARY   ADMISSION DIAGNOSIS:  Mitral regurgitation with dilated cardiomyopathy.   ADDITIONAL/DISCHARGE DIAGNOSES:  1.  Mitral regurgitation, status post mitral valve repair and placement of      left ventricular epicardial pacemaker leads, completed on March 21, 2004.  2.  Dilated cardiomyopathy.  3.  Type 2 diabetes mellitus.  4.  Hypertension.  5.  Hyperlipidemia.  6.  Longstanding tobacco abuse.  7.  History of chronic congestive heart failure with baseline ejection      fraction of 20% to 25%.  8.  Remote history of ovarian cancer.  9.  Obesity.  10. Chronic renal insufficiency.   HOSPITAL MANAGEMENT/PROCEDURES:  1.  Median sternotomy with mitral valve annuloplasty and placement of left      ventricular epicardial pacemaker leads, completed on March 21, 2004      by Dr. Purcell Nails.  2.  Intraoperative transesophageal echocardiography completed on March 21, 2004 by Dr. Guadalupe Maple.  3.  Initiation of cardiac rehabilitation, phase 1.  4.  Blood transfusion for postoperative anemia.   CONSULTATIONS:  1.  Cardiac Rehab.  2.  Case Management.  3.  Diabetes coordinator.   HISTORY OF PRESENT ILLNESS:  Ms. Lyons is a 65 year old African American  female with a known history of nonischemic cardiomyopathy with mitral  regurgitation.  The patient has a history of problems with congestive heart  failure, at least intermittently, for several years.  A 2-D echocardiogram  completed in March of 2003 was notable for the presence of dilated  cardiomyopathy with depressed left ventricular function and an  ejection  fraction estimated at 25% to 30%.  Coronary arteriography was notable for  the absence of significant proximal coronary artery disease.  The patient  has been treated medically over the past several years by Dr. Cristy Hilts. Ganji.  A followup echocardiogram was completed in July of 2005 after an admission  to the hospital for chest discomfort and fluttering of heart.  This was  notable for the persistence of severe left ventricular dysfunction with an  ejection fraction estimated at 25% to 30% and at least moderate mitral  regurgitation.  The left ventricle was dilated, as was the left atrium.  Ms.  Casimir has since then been seen in followup by Dr. Jacinto Halim and the dimensions of  her left ventricular chamber have increased somewhat in size.  Because of  evidence of worsening dilated cardiomyopathy with associated mitral  regurgitation, the patient was referred to CVTS for consideration of  surgical intervention with elective mitral valve repair.   The patient was seen and examined in consultation at the CVTS office on  January 03, 2004 by Dr. Cornelius Moras.  His impression was that indeed the patient had  dilated nonischemic cardiomyopathy with at least a moderate-to-severe mitral  regurgitation.  At that time, he felt the patient would benefit from  elective mitral valve repair.  He discussed the risks, benefits and  alternatives to the procedure with the patient and her family at that time.  The patient was in understanding and agreed to proceed with surgery.   HOSPITAL COURSE:  Ms. Jaye was admitted electively to [facility]  then on March 21, 2004.  The patient was taken to the operating room and  underwent mitral valve annuloplasty with a #26 PhysioRing annuloplasty.  Dual ventricular epicardial pacemaker leads were also placed at that time.  An intraoperative transesophageal echocardiogram was completed and showed no  mitral regurgitation after cardiopulmonary bypass.  Overall,  the patient  tolerated this procedure well and was transferred to the surgical intensive  care unit in critical but stable condition.  The patient was extubated in  the a.m. of postoperative day #1.  She continued to require pressor support  with dopamine, milrinone and nitroglycerin.  She was atrial-paced for a slow  junctional rhythm.  Postoperatively, the patient's course was somewhat  complicated secondary to poorly controlled diabetes and slow junctional  rhythm on telemetry.  Otherwise, the patient continued to make slow but  steady progress towards recovery.  She was weaned from pressor support.  Her  invasive lines were discontinued.  Her chest tubes were discontinued without  difficulty.  Her heart rate and rhythm stabilized, and she was deemed  appropriate for transfer to the stepdown unit on March 25, 2004, or on  postop day #4.   While on the stepdown unit, the patient continued to make steady progress  towards recovery.  She had no further complications.  She was deemed  appropriate for initiation of discharge planning then on March 29, 2004,  or postop day #8.  Of note, the patient's white blood cell count had risen  to 16.5 from 12.4.  The patient had a small amount of serosanguineous  drainage from the distal sternal incision, otherwise, she had no evidence of  gross infection.  She had been afebrile for greater than 24 hours.  She  denied any dysuria, urgency or frequency.  A UA was sent, as well as a  repeat PA and lateral chest x-ray, and a repeat CBC for the a.m. of March 30, 2004.   At the time of initiation of discharge, the patient's vital signs were as  follows:  She had remained afebrile, her blood pressure was 138/77, heart  rate was in the 60s and regular, respirations were 20 and nonlabored, SPO2  was 95% and 96% on room air and her weight was 232 pounds with a preoperative weight of 227 pounds.  Twenty-four-hour bedside glucose  measurements were  as follows:  115, 84, 73 and 118.  On physical exam, her  heart was in a regular rate and rhythm, reading normal sinus rhythm to  junctional rhythm on telemetry.  Her lungs were slightly diminished at the  bases, otherwise were clear.  Her abdomen was soft, nontender and non-  distended with good bowel sounds.  Her extremities were without edema.  Her  sternum was stable with a small amount of serosanguineous drainage from the  distal sternal incision.   We will continue as planned with discharge in the a.m. of March 30, 2004.  We will repeat a CBC to  monitor for a trend towards normal.  We will also  obtain a PA and lateral chest x-ray in the a.m. of March 30, 2004.  If  all remains stable, we will continue as planned with discharge.   DISCHARGE LABORATORIES:  Lab values at the time of discharge planning are as  follows:  PT/INR are 21.3 and 2.4, respectively.  BMET from March 29, 2004 reads as follows:  Sodium of 138, potassium of 5.1, chloride of 104,  CO2 of 27, BUN of 29, creatinine of 1.9 and glucose of 52.  CBC from  March 29, 2004 reads WBC of 16.5, hemoglobin of 9.7, hematocrit of 28.8  and platelet count of 417,000.   DISCHARGE MEDICATIONS:  1.  Aspirin 81 mg daily.  2.  Lanoxin 0.125 mg daily.  3.  Lisinopril 20 mg daily.  4.  Lipitor 40 mg daily.  5.  Warfarin 2.5 mg daily, then as directed.  6.  Lantus insulin 64 units nightly.  7.  Hydrochlorothiazide 25 mg daily.  8.  Spironolactone 50 mg daily.  9.  Glucotrol 10 mg daily.  10. Ultram 50 mg 1-2 tabs every 4-6 hours as needed for pain.   DISCHARGE INSTRUCTIONS:  1.  Activity:  The patient is to avoid driving.  She is to avoid heavy      lifting of strenuous activity.  She is to continue walk daily.  She is      to continue her breathing exercises.  2.  Diet:  The patient should follow a low-fat, low-salt, carbohydrate-      modified, medium-calorie diet.  3.  Wound care:  The patient may shower.  She  is to wash her incisions daily      with soap and water.  She is to notify the CVTS office if she has any      wound problems as noted on the fact sheet.  4.  Special instructions:      1.  The patient is to monitor her blood sugars daily and record the          readings for her primary care doctor, Dr. Donald Pore.      2.  The patient is to obtain PT/INR blood work on Monday, April 03, 2004, by [facility].   FOLLOWUP APPOINTMENTS:  1.  The patient is scheduled to see Dr. Nanetta Batty of [facility]      Cardiology on April 11, 2004 9 a.m.  2.  The patient is scheduled to see Dr. Cornelius Moras on Monday, April 24, 2004,      at 1 p.m.  The patient is to go to [facility] 1 hour      prior to that appointment to obtain a PA and lateral chest x-ray.       CAF/MEDQ  D:  03/29/2004  T:  03/30/2004  Job:  [id number]   cc:   Cristy Hilts. Jacinto Halim, MD [address]  [place]  [place] [address]  Fax: [phone]   Donald Pore, MD  Internal Medicine Resident - [facility]  [place]  [place] [address]  Fax: [phone]

## 2010-09-22 NOTE — Op Note (Signed)
NAMEATLANTA, PELTO                ACCOUNT NO.:  [id number]   MEDICAL RECORD NO.:  [id number]          PATIENT TYPE:  INP   LOCATION:  2305                         FACILITY:  [facility]   PHYSICIAN:  Guadalupe Maple, M.D.  DATE OF BIRTH:  [birth year]   DATE OF PROCEDURE:  03/21/2004  DATE OF DISCHARGE:                                 OPERATIVE REPORT   PROCEDURE:  Intraoperative transesophageal echocardiography.   [name] is a 65 year old African-American female who had  nonischemic cardiomyopathy and severe mitral regurgitation who is scheduled  to undergo mitral valve repair by Dr. Tressie Stalker.  Intraoperative  transesophageal echocardiography was requested to evaluate the mitral valve  function and left ventricular function and to assess the adequacy of repair.   The patient was brought to the operating room at [facility], and  general anesthesia was induced without difficulty.  The trachea was  intubated without difficulty.  The transesophageal echocardiography probe  was then inserted in the esophagus without difficulty.   IMPRESSION:   PRE-BYPASS FINDINGS:  1.  Moderate mitral regurgitation.  There appeared to be some redundancy of      the anterior leaflet of the mitral valve.  There was a central jet of      mitral regurgitation that was directed somewhat posteriorly.  There was      no prolapsing segments of the mitral valve leaflets and no fluttering or      evidence of torn cordae.  The subvalvular apparatus appeared intact and      was normal appearing.  The pulse wave Doppler interrogation of the left      and right pulmonary vein showed forward flow distally and diastole.      There appeared to be dilation of the mitral valve anulus and some      basilar displacement of the mitral valve coaptation point.  2.  The aortic valve showed mildly thickened aortic leaflets which opened      normally.  There was trace aortic insufficiency.  There was no  significant calcification of the aortic leaflets.  3.  The left ventricle showed mild to moderate left ventricular dysfunction.      There was moderate to severe left ventricular hypertrophy.  The left      ventricular wall thickness at the anterior and posterior wall with end      diastole at the mid halfway level measured 1.55 to 1.6 cm.  There was      severe hypokinesis of the anterior wall and anterior septum.  There was      good contractility of the lateral wall and hypokinesis of the inferior      wall.  Ejection fraction was estimated at 45%.  There was no thrombus      noted in the ventricular apex.  4.  The right ventricular function appeared normal.  There was good      contractility of the right ventricular free wall.  The right ventricular      size appeared normal.  5.  The tricuspid valve showed trace to 1+  tricuspid insufficiency, but the      tricuspid valve leaflet appeared to be structurally normal.  6.  The inner atrial septum was intact without evidence of patent foramen      ovale or atrial septal defect.  7.  There was no thrombus noted in the left atrium or left atrial appendage.      The left atrial size appeared to be somewhat, although I was unable to      obtain a view encompassing the area of the left atrium or measure the      diameters of the left atrium.  8.  The proximal ascending aorta appeared somewhat ectatic.  There was some      mile atheromatous disease of the ascending aorta.  9.  The descending aorta appeared to be within normal limits in size with      mild atheromatous disease present.  The descending aorta measured 2.48      cm in diameter.   POST BYPASS FINDINGS:  1.  There was an annuloplasty ring noted at the mitral position. There was a      trapdoor appearance of the anterior leaflet with the posterior leaflet      immobile.  It was just a normal appearing annuloplasty ring.  There was      no mitral regurgitation seen.  2.  The aortic  valve appeared unchanged from the pre-bypass studies.  The      leaflets were thickened but not calcified, and there was trace aortic      insufficiency with normal opening of the aortic valve.  3.  The left ventricular function appeared unchanged from the pre-bypass      study.  There was hypokinesis of the anterior wall and the anterior      septum and mild hypokinesis of the inferior wall.  Ejection fraction was      again estimated at 45%.       DCJ/MEDQ  D:  03/21/2004  T:  03/21/2004  Job:  [id number]

## 2010-09-22 NOTE — Discharge Summary (Signed)
NAMEMARGARET, COCKERILL NO.:  [id number]   MEDICAL RECORD NO.:  [id number]          PATIENT TYPE:  INP   LOCATION:  2039                         FACILITY:  [facility]   PHYSICIAN:  Salvatore Decent. Cornelius Moras, M.D. DATE OF BIRTH:  [birth year]   DATE OF ADMISSION:  03/21/2004  DATE OF DISCHARGE:  04/04/2004                                 DISCHARGE SUMMARY   ADDENDUM:   ADMISSION DIAGNOSIS:  Mitral regurgitation with dilated cardiomyopathy.   ADDITIONAL DISCHARGE DIAGNOSES:  1.  Mitral regurgitation, status post mitral valve repair and replacement of      left ventricular epicardial pacemaker leads, completed on March 21, 2004.  2.  Dilated cardiomyopathy.  3.  Type 2 diabetes mellitus.  4.  Hypertension.  5.  Hyperlipidemia.  6.  Longstanding tobacco abuse.  7.  History of chronic congestive heart failure with baseline ejection      fraction of 20 to 25%.  8.  Remote history of ovarian cancer.  9.  Obesity.  10. Chronic renal insufficiency.   HOSPITAL MANAGEMENT/PROCEDURES:  These remain the same as prior dictated  discharge summary.  Most importantly, this included median sternotomy with  mitral valve annuloplasty and placement of left ventricular epicardial  pacemaker leads completed on March 21, 2004, by Dr. Cornelius Moras of [facility].   HOSPITAL COURSE:  Please see prior dictated discharge summary.  Otherwise,  the patient was expected to be discharged on March 30, 2004.  Upon  examination that morning, the patient had an elevated white count of 15,000  as well as new drainage from the distal sternal incision.  IV vancomycin was  started to cover for potential sternal wound infection.  The patient's  discharge was held until this issue was resolved.   Over the next several days, the patient's white count was followed and  showed a trend towards normal.  The patient's sternal incision showed no  further drainage or signs of infection.  An abdominal ultrasound was  completed to rule out cholecystitis.  This was negative for any intra-  abdominal abnormalities.  A CT scan of the chest was also completed to rule  out mediastinitis.  This was negative for any acute process as well.  The  patient's sputum was cultured and showed moderate amounts of gram negative  rods as well as gram positive rods and gram positive cocci.  The patient's  UA was negative and blood cultures were negative.  Overall, the patient  continued to do fairly well and had no further concerns or complaints.  She  was deemed appropriate for discharge  on April 04, 2004.   At the time of discharge, the patient had remained afebrile for greater than  24 hours.  Her vital signs were as follows:  blood pressure 116/64, pulse  80s, respiratory rate 20 and SpO2 96 to 98% on room air.  The patient's  weight is at 222 pounds with a preoperative weight of 227 pounds.  A 24-hour  blood glucose measurements were as follows:  88, 147, 89, and 143. On  physical examination  the patient's heart was in a regular rate and rhythm  with a normal sinus rhythm on telemetry.  Her lungs were clear to  auscultation.  Her abdomen was soft and nontender, nondistended with good  bowel sounds.  Her extremities were without edema.  Her sternal incision was  clean, dry and intact and her sternum was stable.  There was no evidence of  drainage or fluctuance or dehiscence.   DISPOSITION:  Ms. Gulotta is being discharged to home in improved and stable  condition.   DISCHARGE LABORATORY DATA:  PT/INR from April 04, 2004, 19.5 and 2.0,  respectively.  CBC reads wbc of 15.4, down from 16.8 and hemoglobin of 10.0,  hematocrit 29.6 and platelet count of 567.  BMET reads sodium of 139,  potassium 5.1, chloride 106, CO2 28, BUN 25, creatinine 2.6 (baseline  creatinine is 1.9) and glucose of 71.  LFTs were also drawn and these were  within normal limits.   DISCHARGE MEDICATIONS:  Please note, these have changed from  the prior  discharge summary.  1.  Aspirin 81 mg daily.  2.  Coreg 3.125 mg twice daily.  3.  Lipitor 40 mg daily.  4.  Coumadin 5 mg daily.  5.  Glipizide 10 mg daily.  6.  Lantus insulin 30 units every evening.  7.  Avelox 400 mg daily for seven days.  8.  Ultram 50 mg one or two tablets every four to six hours as needed for      pain.  9.  The patient is instructed to not take her Humulin insulin, Lisinopril,      Spironolactone, Digoxin or hydrochlorothiazide until she is seen by Dr.      Allyson Sabal at the [facility].   DISCHARGE INSTRUCTIONS:  Remain the same for activity, diet and wound care.  Other instructions are as follows:  1.  The patient is to monitor her blood sugars daily and record these for      Dr. Jeanella Craze.  2.  The patient is to obtain PT/INR blood work on Monday, April 10, 2004,      by [facility].  3.  The patient is to go to [facility] on Monday, April 10, 2004, for CBC and BMET lab work.  The patient will receive a card in the      mail from the [facility] office for specific details on this appointment.  4.  The patient will receive home health registered nurse per cardiac      surgery protocol to be provided by [facility].   FOLLOW UP:  1.  The patient is scheduled to see Dr. Allyson Sabal, who is covering for Dr. Jacinto Halim      in his absence.  The patient is scheduled to see him on Tuesday,      April 11, 2004, at 9 a.m.  2.  The patient is scheduled to see Dr. Cornelius Moras on Monday, April 24, 2004,      at 1 p.m.  The patient will undergo      a chest x-ray one hour prior to this appointment at [facility].  3.  The patient is to schedule an appointment to see Dr. Donald Pore within      two weeks of discharge for further diabetes management.       CAF/MEDQ  D:  04/04/2004  T:  04/04/2004  Job:  [id number]   cc:  Cristy Hilts. Jacinto Halim, MD [address]  [place]  [place] [address]  Fax:  [phone]   Donald Pore, MD  Internal Medicine Resident - [facility]  [place]  [place] [address]  Fax: [phone]

## 2010-09-22 NOTE — Cardiovascular Report (Signed)
. [facility]  Patient:    [name], [name] Visit Number: [id number] MRN: [id number]          Service Type: CAT Location: [facility] 2856 [id number] Attending Physician:  Pamella Pert Dictated by:   Delrae Rend, M.D. Proc. Date: 07/17/01 Admit Date:  07/17/2001 Discharge Date: 07/17/2001   CC:         Marisue Brooklyn, M.D.  [facility]   Cardiac Catheterization  PROCEDURES PERFORMED: 1. Right and left heart catheterization. 2. Bilateral renal arteriography for evaluation of renovascular    hypertension. 3. Left ventriculography. 4. Right femoral angiography and closure of right femoral arterial    access sheath with Perclose.  INDICATIONS:  The patient is a 65 year old female with a history of hypertension, diabetes, hypercholesterolemia who also has a history of nonischemic dilated cardiomyopathy, who presents with increasing chest pain. Her chest pain was suggestive of angina pectoris, revealing a markedly reduced left ventricular systolic function of 25% and moderately severe mitral regurgitation on echocardiogram and ongoing symptoms of chest pain and shortness of breath, she was brought to the cardiac catheterization lab to evaluate her coronary anatomy and also to evaluate for evidence of pulmonary hypertension secondary to her dilated cardiomyopathy. She also had selective left and right renal arteriography for evaluation of renovascular hypertension.  HEMODYNAMIC DATA: 1. The RA pressures were 12/11 with a mean of 9 mmHg. 2. The right ventricular pressures were 39 with end diastolic    pressure of 9 mmHg. 3. The pulmonary arterial pressures were 39/17 with a mean of    25 mmHg. 4. The pulmonary capillary wedge pressure were 18/13 with a mean    of 12 mmHg. 5. Aortic saturation was 93%. 6. Pulmonary arterial saturation was 46%. 7. The cardiac output by Fick was 2.5 with an index of 1.2 L.  LEFT  VENTRICULOGRAPHY: 1. The left ventricular pressure was 149 with an end diastolic    pressure of 16 mmHg. 2. The central aortic pressure was 148/68 with a mean of 91 mmHg. 3. There was no pressure gradient across the aortic valve. 4. The left ventricle is markedly dilated. There is global    hypokinesis with ejection fraction estimated at 25-30%. 5. There is moderate mitral regurgitation.  CORONARY ARTERIOGRAPHY: 1. Right coronary artery:  The right coronary artery is a nondominant    vessel and disease free. 2. Left main coronary artery:  The left main coronary artery is a large    caliber vessel and is disease free. 3. Left anterior descending coronary artery:  The left anterior descending    coronary artery is a large caliber vessel. It gives origin to a    diagonal 1 and diagonal 2. These are larger branches. The    left anterior descending coronary artery is normal. 4. Circumflex coronary artery:  The circumflex coronary artery is a    dominant vessel. It gives origin to a large obtuse marginal 1 and    several posterior descending branches. It is disease free.  SELECTIVE RIGHT AND LEFT RENAL ARTERIOGRAPHY: 1. Selective right and left renal arteriography revealed unilateral    renal arteries to each side with no disease. 2. Right femoral angiography access sheath closed with Perclose.  FINAL IMPRESSION: 1. Moderately reduced cardiac output and cardiac index calculated    at 2.5 and 1.2 respectively. 2. Moderate pulmonary hypertension. 3. Dilated nonischemic cardiomyopathy with normal coronary arteries.  RECOMMENDATIONS:  The patient will continue risk factor modification and the patient  will be continued on beta blockade therapy and ACE inhibitors. We will to titrate the beta blockers to a maximum tolerable limits.  PROCEDURE TECHNIQUE:  Under the usual sterile precautions using and #8 French right femoral venous and #6 French right femoral artery access, left and  right heart catheterization was performed.  TECHNIQUE RIGHT HEART CATHETERIZATION:  Through the #8 French sheath, a Swan-Ganz catheter was advanced into the inferior vena cava and then the balloon was inflated and the balloon was advanced to the pulmonary artery and pulmonary capillary wedge pressure was easily obtained. The hemodynamics of the pulmonary capillary wedge pressure, pulmonary artery, right ventricle and right atrium was measured. Pulmonary arterial saturation was also obtained. Then the catheter was removed out of the body.  TECHNIQUE OF LEFT HEART CATHETERIZATION:  A #6 [place] multipurpose beta catheter was run through the #6 [place] right femoral artery access sheath. The catheter was then advanced into the left ventricle and left ventricular pressures were monitored. Hand contrast injection of the left ventricle was performed at the level of projection. Then the catheter was flushed with saline and pulled back into the ascending aorta and pressure gradient of the aortic valve was monitored. Then the right coronary artery was selective engaged and arteriography was performed. In similar fashion the left main coronary artery was selectively engaged and arteriography was performed. The the catheter was pulled out into the abdominal aorta and selective right and left renal arteriography was performed. Then the catheter was pulled out of the body. Then a #6 pigtail catheter was advanced into the ascending aorta over a 0.035 mm J-wire and the catheter was gently advanced into the left ventricle. Left ventriculography was performed in the RAO projection, then the catheter was pulled out of the body over the 0.035 mm J-wire.  TECHNIQUE OF FEMORAL ARTERIOGRAPHY:  Right femoral arteriography was performed in the RAO projection and the arterial access sheath was closed with Perclose with excellent hemostasis.  The patient was transferred to the floor in a stable  condition.  Dictated by:   Delrae Rend, M.D. Attending Physician:  Pamella Pert DD:  07/17/01 TD:  07/18/01 Job: [id number]

## 2010-09-22 NOTE — Op Note (Signed)
[name], [name]                ACCOUNT NO.:  [id number]   MEDICAL RECORD NO.:  [id number]          PATIENT TYPE:  INP   LOCATION:  2305                         FACILITY:  [facility]   PHYSICIAN:  Salvatore Decent. Cornelius Moras, M.D. DATE OF BIRTH:  28-Nov-1945   DATE OF PROCEDURE:  03/21/2004  DATE OF DISCHARGE:                                 OPERATIVE REPORT   PREOPERATIVE DIAGNOSIS:  Mitral regurgitation, dilated cardiomyopathy.   POSTOPERATIVE DIAGNOSIS:  Mitral regurgitation, dilated cardiomyopathy.   PROCEDURE:  Median sternotomy for mitral valve annuloplasty and placement of  left ventricular epicardial pacemaker leads.   SURGEON:  Salvatore Decent. Cornelius Moras, M.D.   ASSISTANT:  Rowe Clack, P.A.-C.   ANESTHESIA:  General anesthesia.   INDICATIONS FOR PROCEDURE:  The patient is a 65 year old obese African  American female with history of dilated nonischemic cardiomyopathy, mitral  regurgitation, type 2 diabetes mellitus, hypertension, hyperlipidemia, and  longstanding tobacco abuse.  The patient presents with recurrent and  progressive symptoms of congestive heart failure.  Echocardiogram  demonstrates the presence of moderate to severe mitral regurgitation with  dilated cardiomyopathy and depressed left ventricular function.  Cardiac  catheterization demonstrates no sign of significant coronary artery disease  and confirms the presence of severe left ventricular dysfunction.  A full  consultation note has been dictated previously.  The patient also has  undergone an exercise stress test at which time she demonstrated severe  exercise intolerance due to severe onset of symptoms of shortness of breath  associated with underlying moderate to severe mitral regurgitation.  During  the intervening period of time, the patient has been counseled at length  regarding the need to quit smoking and she has now abstained from tobacco  use for at least two weeks prior to surgery.  Alternative treatment  strategies have been discussed in detail.  Most notably, the patient and her  family have been counseled regarding the possibility of continued medical  therapy with close attention to her underlying medical needs and aggressive  weight loss program, diabetes management, and smoking cessation along with  medical therapy for congestive heart failure.  The alternative of surgical  intervention for definitive treatment of her mitral regurgitation and its  associated risks and benefits have been discussed at length.  The patient  and her family understand and accept all associated risks of surgery  including, but not limited to, risk of death, stroke, myocardial infarction,  congestive heart failure respiratory failure, renal failure, pneumonia,  bleeding requiring blood transfusion, arrhythmia, heart block or bradycardia  requiring permanent pacemaker, possibility of recurrent problems with  congestive heart failure, possibility of recurrent mitral valve dysfunction.  The rationale for placement of epicardial left ventricular pacemaker leads  has been discussed at the request of Dr. Yates Decamp with anticipation of the  possibility that the patient might ultimately need or benefit from cardiac  resynchronization therapy using biventricular pacemaker.  After extensive  consultation, the patient understands and accepts all associated risks of  surgery and desires to proceed as described.   FINDINGS:  1.  Moderate to severe left ventricular dysfunction  with mild to moderate      dilatation of the left ventricle, moderate left ventricular hypertrophy,      and moderate (3+) mitral regurgitation.  2.  Diffuse scarring throughout the anterior and anterolateral wall of the      left ventricle consistent with some type of remote injury in the past as      the possible source of the patient's underlying cardiomyopathy.  3.  No residual mitral regurgitation following successful mitral valve      repair  using ring annuloplasty.   DESCRIPTION OF PROCEDURE:  The patient was brought to the operating room on  the above mentioned date and central monitoring is established by the  anesthesia service under the care and direction of Dr. Kipp Brood.  Specifically, Swan-Ganz catheter is placed through the right internal  jugular approach.  A radial arterial was placed.  Intravenous antibiotics  are administered.  The patient is placed in the supine position on the  operating table.  Following induction with general endotracheal anesthesia,  a Foley catheter is placed.  The patient's chest, abdomen, both groins and  both lower extremities are prepared and draped in a sterile manner.   Baseline transesophageal echocardiogram is performed by Dr. Noreene Larsson.  This  confirms the presence of mild to moderate dilatation of the left ventricular  chamber with moderate left ventricular hypertrophy and moderate left  ventricular dysfunction.  There appears to be slight sluggishness of the  distal anterior wall and septum.  Otherwise there are no significant wall  motion abnormalities.  The patient is noted to have at least moderate (3+)  mitral regurgitation.  This consists of a broad central jet of mitral  regurgitation which essentially fills the entire left atrium.  There is not  flow reversal within the pulmonary veins at the time of echocardiogram  following induction with general anesthesia.  There is no sign of mitral  valve prolapse.  The mitral valve anatomy appears relatively normal with  type II mitral regurgitation secondary to dilatation of the mitral annulus.   A median sternotomy incision is performed.  Pericardium is opened.  The  ascending aorta is normal in appearance.  There is biventricular enlargement  of the heart.  The patient is heparinized systemically.  The ascending aorta  is cannulated for cardiopulmonary bypass.  A venous cannula is placed directly in the superior vena cava.  A  second venous cannula is placed low  in the right atrium with tip extending down the inferior vena cava.  A  retrograde cardioplegia catheter is placed through the right atrium into the  coronary sinus.  Cardiopulmonary bypass is begun.  There appears to be  slightly insufficient venous drainage, and subsequent to this, the IVC  cannula is removed and replaced with a larger bore cannula.  A temperature  probe is placed in the left ventricular septum.  A cardioplegia catheter was  placed in the ascending aorta.   The patient is cooled to 32 degrees systemic temperature.  Aortic crossclamp  is applied and cardioplegia is delivered initially in antegrade fashion  through the aortic root.  Ice saline slush is applied for topical  hypothermia. Supplemental cardioplegia is administered retrograde through  the coronary sinus catheter.  The initial cardioplegic arrest and myocardial  cooling are felt to be excellent.  Repeat doses of cardioplegia are  administered intermittently throughout the crossclamp portion of the  operating room retrograde to the coronary sinus catheter to maintain septal  temperature below  15 degrees C.   The heart is gently retracted toward the surgeon's side to expose the  lateral wall of the left ventricle.  Two epicardial unipolar ventricular  screw and pacemaker leads are placed on either side of a large circumflex  marginal branch high along the lateral wall of the left ventricle.  Each of  these pacemaker leads are Medtronic epicardial lead model number 5071-53 cm.  The first pacemaker lead is serial number [id number].  The second pacemaker  lead is [id number].  The 3-0 silk sutures are utilized to gently secure  pacemaker cables along the epicardial service from the pacemaker leads and  each of these pacemaker leads is then tunneled through a small window in the  pericardium to reach the left pleural space.  Heart is replaced into its  normal position and left  atriotomy incision is performed posteriorly through  the intra-atrial groove.  The mitral valve is exposed using a self-retaining  retractor.  The mitral valve is carefully examined.  There appears to be  dilatation of the mitral annulus.  The mitral valve leaflets appear normal  as does the entire subvalvular apparatus.  There are no areas of mitral  valve prolapse.  There are no areas of thickening or foreshortened cords to  either leaflet.  There is nothing to suggest any significant restriction of  either the anterior or the posterior leaflet.  The overall size of the  anterior leaflet is relatively small in comparison to the size of the  patient's mitral annulus.   Ring annuloplasty is performed using interrupted 2-0 Ethibond horizontal  mattress sutures placed circumferentially around the mitral annulus.  The  mitral annulus is sized to accept a 26 mm annuloplasty ring, and an Edwards life size Physio Ring (model number 4450, serial number [id number]) is secured  in place uneventfully.  After completion of the annuloplasty, the valve is  tested for competence and appears to be perfectly competent with no residual  leak.  Rewarming is begun.   The left atrial appendage is oversewn from within the left atrium using a  two layer closure of running 3-0 Prolene suture.  The left atriotomy is  closed using a standard two layer closure of running 3-0 Prolene suture.  The patient is placed in Trendelenburg position.  The lungs are ventilated  while one final dose of warm hot shot cardioplegia is administered.  The  heart is vented through the aortic root.  The aortic crossclamp is then  removed after total crossclamp time of 63 minutes.   The heart begins to beat spontaneously without need for cardioversion.  The  retrograde cardioplegia catheter is removed.  Epicardial pacing wires are  fixed to the right ventricular outflow tract into the right atrial  appendage.  The IVC cannula is removed  and its cannulation site oversewn  with Prolene suture.  The patient is rewarmed to 37 degrees C temperature.  The patient is weaned from cardiopulmonary bypass without difficulty.  The  patient's rhythm at separation from bypass is slow junctional rhythm.  The  AV sequential pacing is employed.  The patient is weaned from  cardiopulmonary bypass on low dose milrinone and Dopamine.  Total  cardiopulmonary bypass time for the operation is 99 minutes.   Follow-up transesophageal echocardiogram performed by Dr. Noreene Larsson after  separation from bypass demonstrates preserved left ventricular function with  overall no significant changes and no new wall motion abnormalities.  There  is a well seated mitral annuloplasty ring with  no residual mitral  regurgitation.  There is no significant residual air.   The aortic root vent is removed.  The SVC cannula and the aortic cannula are  both removed.  Protamine is administered to reverse the anticoagulation.  The mediastinum is irrigated with saline solution containing vancomycin.  Meticulous surgical hemostasis is ascertained.  The left ventricular  epicardial pacemaker leads are tunneled through the left anterior chest wall  into a small subcutaneous pocket created through a small lateral incision  made along the left anterior chest wall.  After these leads are passed  through the chest wall, each of them are individually tested for sensing and  pacing thresholds.  Lead [phone] is noted to have slightly increased  impedance and increased pacing threshold with total R-wave measured 18 MV,  impedance 1573 ohms, pacing threshold 1.6 v.  Pacemaker lead [phone] has  better characteristics with R-waves measured 23.7 MV, impedance of 1178, and  pacing threshold of 0.9 v.  Each of these pacemaker leads is then capped off  and the pacemaker cables are rolled and placed in the subcutaneous pocket.  Mediastinum and left pleural space are drained using three chest  tubes  exited through separate stab incisions inferiorly.  The median sternotomy is  closed using double strength sternal wires.  The soft tissues anterior to  the sternum are closed in multiple layers and the skin is closed with a  running subcuticular skin closure.  The small subcutaneous pocket in the  left anterior chest wall is also closed with multiple interrupted absorbable  sutures in the subcutaneous tissues and a running subcuticular skin closure.   The patient tolerated the procedure well and is transported to the surgical  intensive care unit in stable condition.  There are no intraoperative  complications.  All sponge, needle and instrument counts verified correct at  completion of the operation.  The patient was transfused two units packed  red blood cells during cardiopulmonary bypass due to anemia and the ongoing  volume requirement.       CHO/MEDQ  D:  03/21/2004  T:  03/21/2004  Job:  [id number]   cc:   Cristy Hilts. Jacinto Halim, MD  [address]  [place]  [place] [address]  Fax: [phone]   Donald Pore, MD  Internal Medicine Resident - [facility]  [place]  [place] [address]  Fax: [phone]

## 2011-02-06 LAB — GLUCOSE, CAPILLARY: Glucose-Capillary: 290 — ABNORMAL HIGH

## 2011-02-20 LAB — CBC
MCHC: 32.9
RDW: 14.2 — ABNORMAL HIGH

## 2011-02-20 LAB — I-STAT 8, (EC8 V) (CONVERTED LAB)
Acid-Base Excess: 6 — ABNORMAL HIGH
BUN: 10
Bicarbonate: 32.3 — ABNORMAL HIGH
HCT: 46
Operator id: 279831
pCO2, Ven: 54.3 — ABNORMAL HIGH

## 2011-02-20 LAB — DIFFERENTIAL
Basophils Absolute: 0.1
Basophils Relative: 1
Neutro Abs: 7.1
Neutrophils Relative %: 71

## 2011-02-20 LAB — POCT I-STAT CREATININE: Creatinine, Ser: 1.4 — ABNORMAL HIGH

## 2011-03-27 ENCOUNTER — Encounter: Payer: Self-pay | Admitting: Dietician

## 2011-07-11 ENCOUNTER — Other Ambulatory Visit: Payer: Self-pay | Admitting: Adult Health

## 2011-07-11 ENCOUNTER — Other Ambulatory Visit (HOSPITAL_BASED_OUTPATIENT_CLINIC_OR_DEPARTMENT_OTHER): Payer: Self-pay | Admitting: Internal Medicine

## 2011-07-11 DIAGNOSIS — Z1231 Encounter for screening mammogram for malignant neoplasm of breast: Secondary | ICD-10-CM

## 2011-08-07 ENCOUNTER — Ambulatory Visit (HOSPITAL_COMMUNITY): Payer: Self-pay

## 2011-09-07 ENCOUNTER — Ambulatory Visit (HOSPITAL_COMMUNITY)
Admission: RE | Admit: 2011-09-07 | Discharge: 2011-09-07 | Disposition: A | Discharge status: Home / Self Care | Payer: Medicare Other | Source: Ambulatory Visit | Attending: Adult Health | Admitting: Adult Health

## 2011-09-07 DIAGNOSIS — Z1231 Encounter for screening mammogram for malignant neoplasm of breast: Secondary | ICD-10-CM

## 2011-09-11 ENCOUNTER — Other Ambulatory Visit: Payer: Self-pay | Admitting: Adult Health

## 2011-09-11 DIAGNOSIS — R928 Other abnormal and inconclusive findings on diagnostic imaging of breast: Secondary | ICD-10-CM

## 2011-10-24 ENCOUNTER — Ambulatory Visit
Admission: RE | Admit: 2011-10-24 | Discharge: 2011-10-24 | Disposition: A | Discharge status: Home / Self Care | Payer: Medicaid Other | Source: Ambulatory Visit | Attending: Adult Health | Admitting: Adult Health

## 2011-10-24 DIAGNOSIS — R928 Other abnormal and inconclusive findings on diagnostic imaging of breast: Secondary | ICD-10-CM

## 2011-11-13 ENCOUNTER — Other Ambulatory Visit (HOSPITAL_BASED_OUTPATIENT_CLINIC_OR_DEPARTMENT_OTHER): Payer: Self-pay | Admitting: Internal Medicine

## 2011-11-13 DIAGNOSIS — N63 Unspecified lump in unspecified breast: Secondary | ICD-10-CM

## 2011-11-16 ENCOUNTER — Ambulatory Visit
Admission: RE | Admit: 2011-11-16 | Discharge: 2011-11-16 | Disposition: A | Discharge status: Home / Self Care | Payer: Medicaid Other | Source: Ambulatory Visit | Attending: Internal Medicine | Admitting: Internal Medicine

## 2011-11-16 DIAGNOSIS — N63 Unspecified lump in unspecified breast: Secondary | ICD-10-CM

## 2012-01-18 ENCOUNTER — Other Ambulatory Visit (HOSPITAL_BASED_OUTPATIENT_CLINIC_OR_DEPARTMENT_OTHER): Payer: Self-pay | Admitting: Internal Medicine

## 2012-01-18 DIAGNOSIS — N6320 Unspecified lump in the left breast, unspecified quadrant: Secondary | ICD-10-CM

## 2012-04-29 ENCOUNTER — Ambulatory Visit
Admission: RE | Admit: 2012-04-29 | Discharge: 2012-04-29 | Disposition: A | Discharge status: Home / Self Care | Payer: Medicare Other | Source: Ambulatory Visit | Attending: Internal Medicine | Admitting: Internal Medicine

## 2012-04-29 ENCOUNTER — Other Ambulatory Visit (HOSPITAL_BASED_OUTPATIENT_CLINIC_OR_DEPARTMENT_OTHER): Payer: Self-pay | Admitting: Internal Medicine

## 2012-04-29 DIAGNOSIS — N6320 Unspecified lump in the left breast, unspecified quadrant: Secondary | ICD-10-CM

## 2012-08-06 ENCOUNTER — Other Ambulatory Visit: Payer: Self-pay | Admitting: Geriatric Medicine

## 2012-08-06 MED ORDER — METHYLPHENIDATE HCL 5 MG PO TABS: ORAL_TABLET | ORAL | Status: DC

## 2012-08-15 ENCOUNTER — Non-Acute Institutional Stay (SKILLED_NURSING_FACILITY): Payer: Medicare Other | Admitting: Adult Health

## 2012-08-15 ENCOUNTER — Encounter: Payer: Self-pay | Admitting: Adult Health

## 2012-08-15 DIAGNOSIS — E119 Type 2 diabetes mellitus without complications: Secondary | ICD-10-CM

## 2012-08-15 DIAGNOSIS — I509 Heart failure, unspecified: Secondary | ICD-10-CM

## 2012-08-15 DIAGNOSIS — I10 Essential (primary) hypertension: Secondary | ICD-10-CM

## 2012-08-15 DIAGNOSIS — K219 Gastro-esophageal reflux disease without esophagitis: Secondary | ICD-10-CM | POA: Insufficient documentation

## 2012-08-15 DIAGNOSIS — E559 Vitamin D deficiency, unspecified: Secondary | ICD-10-CM | POA: Insufficient documentation

## 2012-08-15 NOTE — Progress Notes (Signed)
Patient ID: [name], female   DOB: 08/19/1945, 67 y.o.   MRN: [id number]  Chief Complaint  Patient presents with  . Medical Managment of Chronic Issues    HPI:  CONGESTIVE HEART FAILURE Is stable is taking lasix 40 mg daily with k+ 20 meq daily   Unspecified vitamin D deficiency Is taking vitamin d 50,000 units monthly  HYPERTENSION Is stable is taking cozaar 50 mg daily takes asa 81 mg daily takes coreg 25 mg twice daily   GERD (gastroesophageal reflux disease) Is stable is taking protonix 40 mg twice daily   ANXIETY Her status is stable is taking long term ritalin 5 mg twice daily   DIABETES MELLITUS, TYPE II Is stable is taking lantus 25 units daily takes novolgo 5 units as breakfast and 7 units ac lunch and supper  UTI She has not had a recent uti is taking urecholine 20 mg three times daily    No past medical history on file.  Past Surgical History  Procedure Laterality Date  . Pacemaker insertion      VITAL SIGNS BP 128/86  Pulse 74  Ht 5\' 6"  (1.676 m)  Wt 210 lb (95.255 kg)  BMI 33.91 kg/m2   Patient's Medications  New Prescriptions   No medications on file  Previous Medications   ASPIRIN 81 MG TABLET    Take 81 mg by mouth daily.   ATORVASTATIN (LIPITOR) 20 MG TABLET    Take 60 mg by mouth daily.   BETHANECHOL (URECHOLINE) 10 MG TABLET    Take 20 mg by mouth 3 (three) times daily.   CARVEDILOL (COREG) 25 MG TABLET    Take 25 mg by mouth 2 (two) times daily with a meal.   ERGOCALCIFEROL (VITAMIN D2) 50000 UNITS CAPSULE    Take 50,000 Units by mouth every 30 (thirty) days.   FUROSEMIDE (LASIX) 40 MG TABLET    Take 40 mg by mouth daily.   INSULIN ASPART (NOVOLOG) 100 UNIT/ML INJECTION    Inject 5 Units into the skin 3 (three) times daily before meals. 5 units ac breakfast; and 7 units ac lunch and supper   INSULIN GLARGINE (LANTUS) 100 UNIT/ML INJECTION    Inject 25 Units into the skin at bedtime.   LOSARTAN (COZAAR) 50 MG TABLET    Take 50 mg by  mouth daily.   METHYLPHENIDATE (RITALIN) 5 MG TABLET    Take one tablet by mouth twice daily before breakfast and lunch.   MULTIPLE VITAMIN (MULTIVITAMIN) TABLET    Take 1 tablet by mouth daily.   PANTOPRAZOLE (PROTONIX) 40 MG TABLET    Take 40 mg by mouth 2 (two) times daily.   POLYETHYL GLYCOL-PROPYL GLYCOL (SYSTANE ULTRA OP)    Apply 1 drop to eye 3 (three) times daily. Both eyes   POTASSIUM CHLORIDE SA (K-DUR,KLOR-CON) 20 MEQ TABLET    Take 20 mEq by mouth daily.  Modified Medications   No medications on file  Discontinued Medications   No medications on file    SIGNIFICANT DIAGNOSTIC EXAMS  09-10-11: wbc 10.2; hgb 12.7; hct 40.3; mcv 71.8; plt 238; glucose 125; bun 22; creat 0.80; k+ 3.6; na++ 140 liver normal albumin 3.5; chol 158; ldl 103; trig 76 10-22-11: psa 0.34; (2012: 0.329) 9-16-13wbc 8.3; hgb 12.6; hct 39.3; mcv 70.9; plt 278; glucose 96; bun 16; creat 0.69; k+ 3.7; na++ 141 chol 135; ldl 82; trig 65; hgb a1c 6.3  03-28-12: liver normal albumin 3.7   LAB REVIEWED:  09-10-11:  wbc 10.2; hgb 12.7; hct 40.3; mcv 71.8; plt 238; glucose 125; bun 22; creat 0.80; k+ 3.6; na++ 140 liver normal albumin 3.5; chol 158; ldl 103; trig 76  9-16-13wbc 8.3; hgb 12.6; hct 39.3; mcv 70.9; plt 278; glucose 96; bun 16; creat 0.69; k+ 3.7; na++ 141 chol 135; ldl 82; trig 65; hgb a1c 6.3  03-28-12: liver normal albumin 3.7  05-30-12: wbc 9.3; hgb 11.0; hct 32.9; mcv 89.6; plt 239; glucose 96; bun 23; creat 1.52; k+ 4.0 Na++ 140; liver normal albumin 3.4; hgb a1c 6.7    Review of Systems  Constitutional: Negative.   Respiratory: Negative for cough and shortness of breath.   Cardiovascular: Negative for chest pain and leg swelling.  Gastrointestinal: Negative for heartburn, abdominal pain and constipation.  Musculoskeletal: Negative for myalgias, back pain and joint pain.  Skin: Negative.   Neurological: Negative for headaches.  Psychiatric/Behavioral: Negative for depression. The patient  does not have insomnia.      Physical Exam  Constitutional: She is oriented to person, place, and time. She appears well-developed and well-nourished.  obese  Neck: Neck supple.  Cardiovascular: Normal rate, regular rhythm and intact distal pulses.   Respiratory: Effort normal.  GI: Soft. Bowel sounds are normal.  Musculoskeletal:  Out of bed to wheelchair left side hemiparesis; has left hand splint  Neurological: She is alert and oriented to person, place, and time.  Skin: Skin is warm and dry.  Psychiatric: She has a normal mood and affect.       ASSESSMENT/ PLAN:  Will not make medication regimen changes at this time; will continue to monitor her status will make further changes as indicated;  FUTURE ORDERS:  Will check lipids next draw; will be due for mammogram in June 2014.

## 2012-08-15 NOTE — Assessment & Plan Note (Signed)
Is stable is taking lasix 40 mg daily with k+ 20 meq daily

## 2012-08-15 NOTE — Assessment & Plan Note (Signed)
Is stable is taking protonix 40 mg twice daily  

## 2012-08-15 NOTE — Assessment & Plan Note (Signed)
Is stable is taking lantus 25 units daily takes novolgo 5 units as breakfast and 7 units ac lunch and supper

## 2012-08-15 NOTE — Assessment & Plan Note (Signed)
She has not had a recent uti is taking urecholine 20 mg three times daily

## 2012-08-15 NOTE — Assessment & Plan Note (Signed)
Is stable is taking cozaar 50 mg daily takes asa 81 mg daily takes coreg 25 mg twice daily

## 2012-08-15 NOTE — Assessment & Plan Note (Signed)
Is taking vitamin d 50,000 units monthly

## 2012-08-15 NOTE — Assessment & Plan Note (Signed)
Her status is stable is taking long term ritalin 5 mg twice daily

## 2012-09-08 ENCOUNTER — Other Ambulatory Visit: Payer: Self-pay | Admitting: Geriatric Medicine

## 2012-09-08 MED ORDER — METHYLPHENIDATE HCL 5 MG PO TABS: ORAL_TABLET | ORAL | Status: DC

## 2012-10-03 ENCOUNTER — Non-Acute Institutional Stay (SKILLED_NURSING_FACILITY): Payer: Medicare Other | Admitting: Adult Health

## 2012-10-03 DIAGNOSIS — N390 Urinary tract infection, site not specified: Secondary | ICD-10-CM

## 2012-10-03 DIAGNOSIS — E1149 Type 2 diabetes mellitus with other diabetic neurological complication: Secondary | ICD-10-CM

## 2012-10-03 DIAGNOSIS — K219 Gastro-esophageal reflux disease without esophagitis: Secondary | ICD-10-CM

## 2012-10-03 DIAGNOSIS — I69959 Hemiplegia and hemiparesis following unspecified cerebrovascular disease affecting unspecified side: Secondary | ICD-10-CM

## 2012-10-03 DIAGNOSIS — I509 Heart failure, unspecified: Secondary | ICD-10-CM

## 2012-10-03 DIAGNOSIS — F411 Generalized anxiety disorder: Secondary | ICD-10-CM

## 2012-10-03 DIAGNOSIS — E785 Hyperlipidemia, unspecified: Secondary | ICD-10-CM

## 2012-10-03 DIAGNOSIS — I10 Essential (primary) hypertension: Secondary | ICD-10-CM

## 2012-10-06 ENCOUNTER — Other Ambulatory Visit (HOSPITAL_BASED_OUTPATIENT_CLINIC_OR_DEPARTMENT_OTHER): Payer: Self-pay | Admitting: Internal Medicine

## 2012-10-06 DIAGNOSIS — Z1231 Encounter for screening mammogram for malignant neoplasm of breast: Secondary | ICD-10-CM

## 2012-11-04 ENCOUNTER — Other Ambulatory Visit: Payer: Self-pay | Admitting: *Deleted

## 2012-11-04 MED ORDER — METHYLPHENIDATE HCL 5 MG PO TABS: ORAL_TABLET | ORAL | Status: DC

## 2012-11-05 ENCOUNTER — Ambulatory Visit
Admission: RE | Admit: 2012-11-05 | Discharge: 2012-11-05 | Disposition: A | Discharge status: Home / Self Care | Payer: Medicare Other | Source: Ambulatory Visit | Attending: Internal Medicine | Admitting: Internal Medicine

## 2012-11-05 ENCOUNTER — Ambulatory Visit: Payer: Medicare Other

## 2012-11-05 ENCOUNTER — Other Ambulatory Visit (HOSPITAL_BASED_OUTPATIENT_CLINIC_OR_DEPARTMENT_OTHER): Payer: Self-pay | Admitting: Internal Medicine

## 2012-11-05 ENCOUNTER — Ambulatory Visit: Admission: RE | Admit: 2012-11-05 | Payer: Medicare Other | Source: Ambulatory Visit

## 2012-11-05 DIAGNOSIS — Z1231 Encounter for screening mammogram for malignant neoplasm of breast: Secondary | ICD-10-CM

## 2012-11-13 ENCOUNTER — Other Ambulatory Visit: Payer: Self-pay

## 2012-11-21 ENCOUNTER — Non-Acute Institutional Stay (SKILLED_NURSING_FACILITY): Payer: Medicare Other | Admitting: Adult Health

## 2012-11-21 DIAGNOSIS — F411 Generalized anxiety disorder: Secondary | ICD-10-CM

## 2012-11-21 DIAGNOSIS — I69959 Hemiplegia and hemiparesis following unspecified cerebrovascular disease affecting unspecified side: Secondary | ICD-10-CM

## 2012-11-21 DIAGNOSIS — E1149 Type 2 diabetes mellitus with other diabetic neurological complication: Secondary | ICD-10-CM

## 2012-11-21 DIAGNOSIS — N390 Urinary tract infection, site not specified: Secondary | ICD-10-CM

## 2012-11-21 DIAGNOSIS — I10 Essential (primary) hypertension: Secondary | ICD-10-CM

## 2012-11-21 DIAGNOSIS — K219 Gastro-esophageal reflux disease without esophagitis: Secondary | ICD-10-CM

## 2012-11-21 DIAGNOSIS — I509 Heart failure, unspecified: Secondary | ICD-10-CM

## 2012-11-21 DIAGNOSIS — E785 Hyperlipidemia, unspecified: Secondary | ICD-10-CM

## 2012-12-24 ENCOUNTER — Other Ambulatory Visit: Payer: Self-pay | Admitting: Geriatric Medicine

## 2012-12-24 MED ORDER — METHYLPHENIDATE HCL 5 MG PO TABS: ORAL_TABLET | ORAL | Status: DC

## 2013-01-12 ENCOUNTER — Other Ambulatory Visit: Payer: Self-pay | Admitting: *Deleted

## 2013-01-12 MED ORDER — METHYLPHENIDATE HCL 5 MG PO TABS: ORAL_TABLET | ORAL | Status: DC

## 2013-01-30 ENCOUNTER — Non-Acute Institutional Stay (SKILLED_NURSING_FACILITY): Payer: Medicare Other | Admitting: Adult Health

## 2013-01-30 DIAGNOSIS — E1149 Type 2 diabetes mellitus with other diabetic neurological complication: Secondary | ICD-10-CM

## 2013-01-30 DIAGNOSIS — I509 Heart failure, unspecified: Secondary | ICD-10-CM

## 2013-01-30 DIAGNOSIS — K219 Gastro-esophageal reflux disease without esophagitis: Secondary | ICD-10-CM

## 2013-01-30 DIAGNOSIS — I10 Essential (primary) hypertension: Secondary | ICD-10-CM

## 2013-01-30 DIAGNOSIS — F411 Generalized anxiety disorder: Secondary | ICD-10-CM

## 2013-01-30 DIAGNOSIS — I69959 Hemiplegia and hemiparesis following unspecified cerebrovascular disease affecting unspecified side: Secondary | ICD-10-CM

## 2013-01-30 DIAGNOSIS — N390 Urinary tract infection, site not specified: Secondary | ICD-10-CM

## 2013-01-30 DIAGNOSIS — E785 Hyperlipidemia, unspecified: Secondary | ICD-10-CM

## 2013-02-16 ENCOUNTER — Other Ambulatory Visit: Payer: Self-pay | Admitting: *Deleted

## 2013-02-16 MED ORDER — METHYLPHENIDATE HCL 5 MG PO TABS: ORAL_TABLET | ORAL | Status: DC

## 2013-02-23 ENCOUNTER — Encounter: Payer: Self-pay | Admitting: Adult Health

## 2013-02-23 DIAGNOSIS — E1149 Type 2 diabetes mellitus with other diabetic neurological complication: Secondary | ICD-10-CM

## 2013-02-23 HISTORY — DX: Type 2 diabetes mellitus with other diabetic neurological complication: E11.49

## 2013-02-23 NOTE — Progress Notes (Signed)
Patient ID: [name], female   DOB: [birth year], 67 y.o.   MRN: [id number]  [place]  Allergies  Allergen Reactions  . Lotensin [Benazepril Hcl]      Chief Complaint  Patient presents with  . Medical Managment of Chronic Issues    HPI: She is being seen for the management of her chronic illnesses. There are no concerns being voiced by the nursing staff at this time. She is telling me that she is feeling good and has no concerns.   No past medical history on file.  Past Surgical History  Procedure Laterality Date  . Pacemaker insertion      VITAL SIGNS BP 120/70  Pulse 74  Ht 5\' 6"  (1.676 m)  Wt 223 lb (101.152 kg)  BMI 36.01 kg/m2   Patient's Medications  New Prescriptions   No medications on file  Previous Medications   ASPIRIN 81 MG TABLET    Take 81 mg by mouth daily.   ATORVASTATIN (LIPITOR) 20 MG TABLET    Take 60 mg by mouth daily.   BETHANECHOL (URECHOLINE) 10 MG TABLET    Take 20 mg by mouth 3 (three) times daily.   CARVEDILOL (COREG) 25 MG TABLET    Take 25 mg by mouth 2 (two) times daily with a meal.   ERGOCALCIFEROL (VITAMIN D2) 50000 UNITS CAPSULE    Take 50,000 Units by mouth every 30 (thirty) days.   FUROSEMIDE (LASIX) 40 MG TABLET    Take 40 mg by mouth daily.   INSULIN ASPART (NOVOLOG) 100 UNIT/ML INJECTION    Inject 5 Units into the skin 3 (three) times daily before meals. 5 units ac breakfast; and 7 units ac lunch and supper   INSULIN GLARGINE (LANTUS) 100 UNIT/ML INJECTION    Inject 25 Units into the skin at bedtime.   LOSARTAN (COZAAR) 50 MG TABLET    Take 50 mg by mouth daily.   METHYLPHENIDATE (RITALIN) 5 MG TABLET    Take one tablet by mouth twice daily before breakfast and lunch.   MULTIPLE VITAMIN (MULTIVITAMIN) TABLET    Take 1 tablet by mouth daily.   PANTOPRAZOLE (PROTONIX) 40 MG TABLET    Take 40 mg by mouth 2 (two) times daily.   POLYETHYL GLYCOL-PROPYL GLYCOL (SYSTANE ULTRA OP)    Apply 1 drop to eye 3 (three) times daily. Both eyes    POTASSIUM CHLORIDE SA (K-DUR,KLOR-CON) 20 MEQ TABLET    Take 20 mEq by mouth daily.  Modified Medications   No medications on file  Discontinued Medications   No medications on file    SIGNIFICANT DIAGNOSTIC EXAMS    09-10-11: wbc 10.2; hgb 12.7; hct 40.3; mcv 71.8; plt 238; glucose 125; bun 22; creat 0.80; k+ 3.6; na++ 140 liver normal albumin 3.5; chol 158; ldl 103; trig 76 10-22-11: psa 0.34; (2012: 0.329) 9-16-13wbc 8.3; hgb 12.6; hct 39.3; mcv 70.9; plt 278; glucose 96; bun 16; creat 0.69; k+ 3.7; na++ 141 chol 135; ldl 82; trig 65; hgb a1c 6.3  03-28-12: liver normal albumin 3.7   LAB REVIEWED:   9-16-13wbc 8.3; hgb 12.6; hct 39.3; mcv 70.9; plt 278; glucose 96; bun 16; creat 0.69; k+ 3.7; na++ 141 chol 135; ldl 82; trig 65; hgb a1c 6.3  03-28-12: liver normal albumin 3.7  05-30-12: wbc 9.3; hgb 11.0; hct 32.9; mcv 89.6; plt 239; glucose 96; bun 23; creat 1.52; k+ 4.0 Na++ 140; liver normal albumin 3.4; hgb a1c 6.7  08-18-12: chol 127; ldl 72; trig 92  Review of Systems  Constitutional: Negative.   Respiratory: Negative for cough and shortness of breath.   Cardiovascular: Negative for chest pain and leg swelling.  Gastrointestinal: Negative for heartburn, abdominal pain and constipation.  Musculoskeletal: Negative for myalgias, back pain and joint pain.  Skin: Negative.   Neurological: Negative for headaches.  Psychiatric/Behavioral: Negative for depression. The patient does not have insomnia.      Physical Exam  Constitutional: She is oriented to person, place, and time. She appears well-developed and well-nourished.  obese  Neck: Neck supple.  Cardiovascular: Normal rate, regular rhythm and intact distal pulses.   Respiratory: Effort normal.  GI: Soft. Bowel sounds are normal.  Musculoskeletal:  Out of bed to wheelchair left side hemiparesis; has left hand splint  Neurological: She is alert and oriented to person, place, and time.  Skin: Skin is warm and  dry.  Psychiatric: She has a normal mood and affect.    ASSESSMENT/ PLAN:  1. Hypertension: she is stable will continue coreg 25 mg twice daily cozaar 50 mg daily and will monitor her status   2. Chf: she is stable will continue lasix 40 mg daily with k+ 20 meq daily and will monitor her status   3. Cva: she is without change in status will continue her asa 81 mg daily and will monitor her status.   4. Diabetes: she is stable will continue her lantus 25 units daily and novolog 5 units with breakfast and 7 units with lunch and supper will monitor  5. Gerd: will lower her protonix to 40 mg daily and will monitor  6. Dyslipidemia: will continue lipitor 60 mg daily  7. Anxiety: will continue her ritalin 5 mg twice daily and will monitor   8. No recent ut's will continue urecholine 20 mg three times daily

## 2013-02-25 NOTE — Progress Notes (Signed)
Patient ID: [name], female   DOB: Aug 29, 1945, 67 y.o.   MRN: [id number]  [facility]  Allergies  Allergen Reactions  . Lotensin [Benazepril Hcl]     Chief Complaint  Patient presents with  . Medical Managment of Chronic Issues    HPI  She is being seen for the management of her chronic illnesses. Overall her status has remained unchanged. She is doing well. There are no concerns being voiced by the nursing staff and she tells me that she is feeling good.   Past Medical History  Diagnosis Date  . HYPERTENSION 02/14/2006    Qualifier: Diagnosis of  By: Danae Chen    . PE 12/31/2008    Qualifier: Diagnosis of  By: Alexandria Lodge PharmD, Vonna Kotyk    . MITRAL REGURGITATION 05/23/2006    Annotation: S/P valve repair Qualifier: Diagnosis of  By: Danae Chen    . CARDIOMYOPATHY 05/23/2006    Annotation: Likely 2/2 EtOH, DM and HTN Qualifier: Diagnosis of  By: Danae Chen    . CONGESTIVE HEART FAILURE 02/14/2006    Qualifier: Diagnosis of  By: Danae Chen    . DVT 12/31/2008    Qualifier: Diagnosis of  By: Polly Cobia MD, Adwait    . Type II or unspecified type diabetes mellitus with neurological manifestations, not stated as uncontrolled(250.60) 02/23/2013  . PERIPHERAL NEUROPATHY 11/18/2006    Qualifier: Diagnosis of  By: Phillips Odor  DO, Beth    . DECUBITUS ULCER, BUTTOCK 02/14/2009    Qualifier: Diagnosis of  By: Eben Burow MD, Ankit    . RENAL INSUFFICIENCY 05/23/2006    Qualifier: Diagnosis of  By: Danae Chen    . HYPOKALEMIA 11/26/2006    Qualifier: Diagnosis of  By: Phillips Odor  DO, Beth    . PACEMAKER, PERMANENT 05/23/2006    Qualifier: Diagnosis of  By: Danae Chen      Past Surgical History  Procedure Laterality Date  . Pacemaker insertion      Current Outpatient Prescriptions on File Prior to Visit  Medication Sig Dispense Refill  . aspirin 81 MG tablet Take 81 mg by mouth daily.      Marland Kitchen atorvastatin (LIPITOR) 20 MG tablet Take 60 mg by mouth  daily.      . bethanechol (URECHOLINE) 10 MG tablet Take 20 mg by mouth 3 (three) times daily.      . carvedilol (COREG) 25 MG tablet Take 25 mg by mouth 2 (two) times daily with a meal.      . ergocalciferol (VITAMIN D2) 50000 UNITS capsule Take 50,000 Units by mouth every 30 (thirty) days.      . furosemide (LASIX) 40 MG tablet Take 40 mg by mouth daily.      . insulin aspart (NOVOLOG) 100 UNIT/ML injection Inject 5 Units into the skin 3 (three) times daily before meals. 5 units ac breakfast; and 7 units ac lunch and supper      . insulin glargine (LANTUS) 100 UNIT/ML injection Inject 25 Units into the skin at bedtime.      Marland Kitchen losartan (COZAAR) 50 MG tablet Take 50 mg by mouth daily.      . Multiple Vitamin (MULTIVITAMIN) tablet Take 1 tablet by mouth daily.      . pantoprazole (PROTONIX) 40 MG tablet Take 40 mg by mouth 2 (two) times daily.      Bertram Gala Glycol-Propyl Glycol (SYSTANE ULTRA OP) Apply 1 drop to eye 3 (three) times daily. Both eyes      .  potassium chloride SA (K-DUR,KLOR-CON) 20 MEQ tablet Take 20 mEq by mouth daily.       No current facility-administered medications on file prior to visit.    Filed Vitals:   11/21/12 1116  BP: 140/70  Pulse: 71  Height: 5\' 6"  (1.676 m)  Weight: 229 lb (103.874 kg)    SIGNIFICANT DIAGNOSTIC EXAMS   LABS REVIEWED:    9-16-13wbc 8.3; hgb 12.6; hct 39.3; mcv 70.9; plt 278; glucose 96; bun 16; creat 0.69; k+ 3.7; na++ 141 chol 135; ldl 82; trig 65; hgb a1c 6.3  03-28-12: liver normal albumin 3.7  05-30-12: wbc 9.3; hgb 11.0; hct 32.9; mcv 89.6; plt 239; glucose 96; bun 23; creat 1.52; k+ 4.0 Na++ 140; liver normal albumin 3.4; hgb a1c 6.7  08-18-12: chol 127; ldl 72; trig 92  10-06-12: wbc 7.3; hgb 10.0; hct 30.5 mcv 87.1; plt 232; glucose 46; bun 24; creat 1.51; k+4.2; na++143; liver normal albumin 3.4; hgb a1c 6.4; vit d 31 10-17-12: chol 127; ldl 76; trig 67    Review of Systems  Constitutional: Negative.   Respiratory: Negative  for cough and shortness of breath.   Cardiovascular: Negative for chest pain and leg swelling.  Gastrointestinal: Negative for heartburn, abdominal pain and constipation.  Musculoskeletal: Negative for myalgias, back pain and joint pain.  Skin: Negative.   Neurological: Negative for headaches.  Psychiatric/Behavioral: Negative for depression. The patient does not have insomnia.      Physical Exam  Constitutional: She is oriented to person, place, and time. She appears well-developed and well-nourished.  obese  Neck: Neck supple.  Cardiovascular: Normal rate, regular rhythm and intact distal pulses.   Respiratory: Effort normal.  GI: Soft. Bowel sounds are normal.  Musculoskeletal:  Out of bed to wheelchair left side hemiparesis; has left hand splint  Neurological: She is alert and oriented to person, place, and time.  Skin: Skin is warm and dry.  Psychiatric: She has a normal mood and affect.    ASSESSMENT/ PLAN:  1. Hypertension: she is stable will continue coreg 25 mg twice daily cozaar 50 mg daily and will monitor her status   2. Chf: she is stable will continue lasix 40 mg daily with k+ 20 meq daily and will monitor her status   3. Cva: she is without change in status will continue her asa 81 mg daily and will monitor her status.   4. Diabetes: she is stable will continue her lantus 25 units daily and novolog 5 units with breakfast and 7 units with lunch and supper will monitor  5. Gerd: will lower her protonix to 40 mg daily and will monitor  6. Dyslipidemia: will continue lipitor 60 mg daily; ldl at goal   7. Anxiety: will continue her ritalin 5 mg twice daily and will monitor   8. No recent ut's will continue urecholine 20 mg three times daily

## 2013-03-02 ENCOUNTER — Encounter: Payer: Self-pay | Admitting: Adult Health

## 2013-03-02 NOTE — Progress Notes (Signed)
Patient ID: [name], female   DOB: 1945/06/03, 67 y.o.   MRN: [id number]  [place]  Allergies  Allergen Reactions  . Lotensin [Benazepril Hcl]      Chief Complaint  Patient presents with  . Medical Managment of Chronic Issues    HPI:  She is being seen for the management of her chronic illnesses. She is doing well overall. She is not voicing any concerns or needs and the nursing staff has not voiced any concerns.   Past Medical History  Diagnosis Date  . HYPERTENSION 02/14/2006    Qualifier: Diagnosis of  By: Danae Chen    . PE 12/31/2008    Qualifier: Diagnosis of  By: Alexandria Lodge PharmD, Vonna Kotyk    . MITRAL REGURGITATION 05/23/2006    Annotation: S/P valve repair Qualifier: Diagnosis of  By: Danae Chen    . CARDIOMYOPATHY 05/23/2006    Annotation: Likely 2/2 EtOH, DM and HTN Qualifier: Diagnosis of  By: Danae Chen    . CONGESTIVE HEART FAILURE 02/14/2006    Qualifier: Diagnosis of  By: Danae Chen    . DVT 12/31/2008    Qualifier: Diagnosis of  By: Polly Cobia MD, Adwait    . Type II or unspecified type diabetes mellitus with neurological manifestations, not stated as uncontrolled(250.60) 02/23/2013  . PERIPHERAL NEUROPATHY 11/18/2006    Qualifier: Diagnosis of  By: Phillips Odor  DO, Beth    . DECUBITUS ULCER, BUTTOCK 02/14/2009    Qualifier: Diagnosis of  By: Eben Burow MD, Ankit    . RENAL INSUFFICIENCY 05/23/2006    Qualifier: Diagnosis of  By: Danae Chen    . HYPOKALEMIA 11/26/2006    Qualifier: Diagnosis of  By: Phillips Odor  DO, Beth    . PACEMAKER, PERMANENT 05/23/2006    Qualifier: Diagnosis of  By: Danae Chen      Past Surgical History  Procedure Laterality Date  . Pacemaker insertion      VITAL SIGNS BP 135/80  Pulse 80  Wt 237 lb (107.502 kg)  BMI 38.27 kg/m2   Patient's Medications  New Prescriptions   No medications on file  Previous Medications   ASPIRIN 81 MG TABLET    Take 81 mg by mouth daily.   ATORVASTATIN  (LIPITOR) 20 MG TABLET    Take 60 mg by mouth daily.   BETHANECHOL (URECHOLINE) 10 MG TABLET    Take 20 mg by mouth 3 (three) times daily.   CARVEDILOL (COREG) 25 MG TABLET    Take 25 mg by mouth 2 (two) times daily with a meal.   ERGOCALCIFEROL (VITAMIN D2) 50000 UNITS CAPSULE    Take 50,000 Units by mouth every 30 (thirty) days.   FUROSEMIDE (LASIX) 40 MG TABLET    Take 40 mg by mouth 2 (two) times daily.    INSULIN ASPART (NOVOLOG) 100 UNIT/ML INJECTION    Inject 5 Units into the skin 3 (three) times daily before meals. 5 units ac breakfast; and 7 units ac lunch and supper   INSULIN GLARGINE (LANTUS) 100 UNIT/ML INJECTION    Inject 25 Units into the skin at bedtime.   LOSARTAN (COZAAR) 50 MG TABLET    Take 50 mg by mouth daily.   METHYLPHENIDATE (RITALIN) 5 MG TABLET    Take one tablet by mouth twice daily before breakfast and lunch.   MULTIPLE VITAMIN (MULTIVITAMIN) TABLET    Take 1 tablet by mouth daily.   PANTOPRAZOLE (PROTONIX) 40 MG TABLET    Take 40 mg  by mouth daily.    POLYETHYL GLYCOL-PROPYL GLYCOL (SYSTANE ULTRA OP)    Apply 1 drop to eye 3 (three) times daily. Both eyes   POTASSIUM CHLORIDE SA (K-DUR,KLOR-CON) 20 MEQ TABLET    Take 20 mEq by mouth daily.  Modified Medications   No medications on file  Discontinued Medications   No medications on file    SIGNIFICANT DIAGNOSTIC EXAMS    LABS REVIEWED:   03-28-12: liver normal albumin 3.7  05-30-12: wbc 9.3; hgb 11.0; hct 32.9; mcv 89.6; plt 239; glucose 96; bun 23; creat 1.52; k+ 4.0 Na++ 140; liver normal albumin 3.4; hgb a1c 6.7  08-18-12: chol 127; ldl 72; trig 92  10-06-12: wbc 7.3; hgb 10.0; hct 30.5 mcv 87.1; plt 232; glucose 46; bun 24; creat 1.51; k+4.2; na++143; liver normal albumin 3.4; hgb a1c 6.4; vit d 31 10-17-12: chol 127; ldl 76; trig 67  12-22-12: glucose 92; bun 24; creat 1.60; k+4.3; na++142    Review of Systems  Constitutional: Negative.   Respiratory: Negative for cough and shortness of breath.    Cardiovascular: Negative for chest pain and leg swelling.  Gastrointestinal: Negative for heartburn, abdominal pain and constipation.  Musculoskeletal: Negative for myalgias, back pain and joint pain.  Skin: Negative.   Neurological: Negative for headaches.  Psychiatric/Behavioral: Negative for depression. The patient does not have insomnia.      Physical Exam  Constitutional: She is oriented to person, place, and time. She appears well-developed and well-nourished.  obese  Neck: Neck supple.  Cardiovascular: Normal rate, regular rhythm and intact distal pulses.   Respiratory: Effort normal.  GI: Soft. Bowel sounds are normal.  Musculoskeletal:  Out of bed to wheelchair left side hemiparesis; has left hand splint  Neurological: She is alert and oriented to person, place, and time.  Skin: Skin is warm and dry.  Psychiatric: She has a normal mood and affect.    ASSESSMENT/ PLAN:  1. Hypertension: she is stable will continue coreg 25 mg twice daily cozaar 50 mg daily and will monitor her status   2. Chf: she is stable will continue lasix 40 mg daily with k+ 20 meq daily and will monitor her status   3. Cva: she is without change in status will continue her asa 81 mg daily and will monitor her status.   4. Diabetes: she is stable will continue her lantus 25 units daily and novolog 5 units with breakfast and 7 units with lunch and supper will monitor  5. Gerd: will lower her protonix to 40 mg daily and will monitor  6. Dyslipidemia: will continue lipitor 60 mg daily; ldl is 76  7. Anxiety: will continue her ritalin 5 mg twice daily and will monitor   8. No recent ut's will continue urecholine 20 mg three times daily

## 2013-03-10 ENCOUNTER — Other Ambulatory Visit: Payer: Self-pay | Admitting: *Deleted

## 2013-03-10 ENCOUNTER — Non-Acute Institutional Stay (SKILLED_NURSING_FACILITY): Payer: Medicare Other | Admitting: Internal Medicine

## 2013-03-10 DIAGNOSIS — E1149 Type 2 diabetes mellitus with other diabetic neurological complication: Secondary | ICD-10-CM

## 2013-03-10 DIAGNOSIS — I69959 Hemiplegia and hemiparesis following unspecified cerebrovascular disease affecting unspecified side: Secondary | ICD-10-CM

## 2013-03-10 DIAGNOSIS — I509 Heart failure, unspecified: Secondary | ICD-10-CM

## 2013-03-10 MED ORDER — METHYLPHENIDATE HCL 5 MG PO TABS: ORAL_TABLET | ORAL | Status: DC

## 2013-03-10 NOTE — Progress Notes (Signed)
Patient ID: [name], female   DOB: 11-12-1945, 67 y.o.   MRN: [id number] Facility; [facility] Chief complaint; review of cardiac status History; is a patient I believe has been in the building since 2010 perhaps before that the. She has a history of having a mitral valve replacement and also a cardiomyopathy [presumed to be alcohol induced at 1 point]. Nevertheless she seems to have done fairly well and she has not had any recent issues with the heart failure and/or cardiac issues. 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 But again has not been not have any particular problems related to this she was discovered to have fairly dense nuclear cataracts and she is scheduled for cataract surgery to her left eye on October 10.   has a past medical history of HYPERTENSION (02/14/2006); PE (12/31/2008); MITRAL REGURGITATION (05/23/2006); CARDIOMYOPATHY (05/23/2006); CONGESTIVE HEART FAILURE (02/14/2006); DVT (12/31/2008); Type II or unspecified type diabetes mellitus with neurological manifestations, not stated as uncontrolled(250.60) (02/23/2013); PERIPHERAL NEUROPATHY (11/18/2006); DECUBITUS ULCER, BUTTOCK (02/14/2009); RENAL INSUFFICIENCY (05/23/2006); HYPOKALEMIA (11/26/2006); and PACEMAKER, PERMANENT (05/23/2006).     Technique:  Contiguous axial images were obtained from the base of the skull through the vertex without contrast.    Comparison: Head CT 11/09/2008, head CT 11/06/2008, CTA of the head and neck 11/09/2008.    Findings: There is encephalomalacia in the right frontal lobe consistent with prior right frontal lobe infarct.    A few tiny hypodensities in the basal ganglia bilaterally appear stable compared to the prior CT and suggest remote lacunar infarcts.    A partially calcified mass adjacent to the falx cerebri, to the right of midline at the vertex, is stable and likely a meningioma.    There is no hemorrhage, hydrocephalus, mass effect or  evidence of acute cortically based infarction.  Cerebral volume loss is stable. The visualized paranasal sinuses and mastoid air cells are clear.    IMPRESSION: 1.  No acute intracranial abnormality is identified.  2.  Remote right frontal lobe infarct.      SONOGRAPHER  Gillermo Murdoch   ORDERING     Riofrio, Alexie  cc:   --------------------------------------------------------------------  Indications:   CVA 436.   --------------------------------------------------------------------  History:  PMH: h/o alcohol abuse. Congestive heart failure.  Non-ischemic cardiomyopathy. Risk factors: Current tobacco use.  Hypertension. Diabetes mellitus. Dyslipidemia.   --------------------------------------------------------------------  Study Conclusions   1. Left ventricle: The cavity size was normal. There was moderate     concentric hypertrophy. Systolic function was mildly to     moderately reduced. The estimated ejection fraction was in the     range of 40% to 45%. Diffuse hypokinesis.  2. Mitral valve: Mitral valve ring noted, s/p repair. No     regurgitation. Valve area by pressure half-time: 2.16cm^2. Valve     area by continuity equation (using LVOT flow): 1.25cm^2.  3. Left atrium: The atrium was normal in size.  4. Right ventricle: Not well visualized.  5. Right atrium: The atrium was normal in size.  6. Tricuspid valve: No regurgitation.  Impressions:   - No cardiac source of embolism was identified, but cannot be ruled    out on the basis of this examination.  Recommendations: Consider transesophageal echocardiography if  clinically indicated.   --------------------------------------------------------------------  Labs, prior tests, procedures, and surgery:  Mitral valve repair.   Echocardiography. M-mode, complete 2D, spectral Doppler, and color  Doppler. Height: Height: 170.2cm. Height: 67in. Weight: Weight:  106.7kg. Weight:  234.7lb. Body mass index: BMI:  36.8kg/m^2. Body  surface area: BSA: 2.38m^2. Patient status: Inpatient. Location:  Bedside.   --------------------------------------------------------------------  Left ventricle: The cavity size was normal. There was moderate  concentric hypertrophy. Systolic function was mildly to moderately  reduced. The estimated ejection fraction was in the range of 40% to  45%. Diffuse hypokinesis.  Aortic valve: Trileaflet; normal thickness, mildly calcified  leaflets. Cusp separation was normal. Mobility was not restricted.  Doppler: Transvalvular velocity was within the normal range. There  was no stenosis. No significant regurgitation.  Aorta: Aortic root: The aortic root was normal in size.  Mitral valve: Mitral valve ring noted, s/p repair. Structurally  normal valve. Mobility was not restricted. Doppler: Transvalvular  velocity was within the normal range. There was no evidence for  stenosis. No regurgitation.  Valve area by pressure half-time:  2.16cm^2. Indexed valve area by pressure half-time: 0.94cm^2/m^2.  Valve area by continuity equation (using LVOT flow): 1.25cm^2.  Indexed valve area by continuity equation (using LVOT flow):  0.55cm^2/m^2.  Mean gradient: 2mm Hg (D). Peak gradient: 7mm Hg (D).  Left atrium: The atrium was normal in size.  Right ventricle: Not well visualized. The cavity size was normal.  Wall thickness was normal. Systolic function was normal.  Pulmonic valve: Poorly visualized. Doppler: Transvalvular velocity  was within the normal range. There was no evidence for stenosis.  Tricuspid valve: Structurally normal valve. Leaflet separation was  normal. Doppler: Transvalvular velocity was within the normal range.  No regurgitation.  Pulmonary artery: Poorly visualized. The main pulmonary artery was  normal-sized. Systolic pressure was within the normal range.  Right atrium: The atrium was normal in size.  Pericardium: There was no pericardial effusion.  Systemic  veins:  Inferior vena cava: The vessel was normal in size.    Medications; Lantus insulin 25 units every night, NovoLog insulin 5 units before breakfast and 7 units a.c. lunch and dinner, Lasix 40 mg twice daily, Coreg 25 twice a day, Ritalin 5 mg every morning and a.c. Lunch, Ure choline 10 mg 2 tablets 20 mg 3 times a day, Lipitor 20 mg along with a 40 mg tablet to make 60 mg, K-Dur 20 milliequivalents daily, vitamin D 50,000 units monthly, enteric-coated aspirin 81 daily, losartan 50 mg daily, protonic 40 mg daily,  Last lab work on August 18 showed a BUN of 24 creatinine 1.6 as opposed to 24 and 1.51 respectively on June2. Last lipid panel showed an LDL of 76 on June 13. Last hemoglobin A1c was 6.4 on June  Review of systems; Respiratory; no clear shortness of breath Cardiac no clear chest pain or palpitations GI no nausea vomiting.  Physical examination Gen. patient is not in any obvious distress. She almost appears to have involuntary movements on the right side involving her right arm and her head Respiratory; clear entry bilaterally no crackles or wheezes Cardiac previous sternotomy. 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 I don't see obvious evidence that this is changed in the time I have known her  Impression/plan #1 cardiomyopathy see. echocardiogram above. This is been stable without problems in many years. I see no contraindication to her upcoming cataract extraction. She has a pacemaker, we'll need to see who is checking on this #2 status post mitral valve repair she appears to be doing well from this  regard. At some point her note stated she had a mitral valve replacement also if I can research this is some point in any case this seems her stable #3 type 2 diabetes on insulin with a history of  peripheral neuropathy. I see no issues here. She is on insulin we'll check and see what instructions with regards to diet the ophthalmologist office usually suggests with regards oral intake on the day of her surgery. I will adjust her insulin prior to cataract surgery #4 on Ritalin for reasons that are not totally clear. Will taper this to qam.

## 2013-03-13 ENCOUNTER — Encounter: Payer: Self-pay | Admitting: Adult Health

## 2013-03-13 ENCOUNTER — Non-Acute Institutional Stay (SKILLED_NURSING_FACILITY): Payer: Medicare Other | Admitting: Adult Health

## 2013-03-13 DIAGNOSIS — E785 Hyperlipidemia, unspecified: Secondary | ICD-10-CM

## 2013-03-13 DIAGNOSIS — F411 Generalized anxiety disorder: Secondary | ICD-10-CM

## 2013-03-13 DIAGNOSIS — N390 Urinary tract infection, site not specified: Secondary | ICD-10-CM

## 2013-03-13 DIAGNOSIS — I509 Heart failure, unspecified: Secondary | ICD-10-CM

## 2013-03-13 DIAGNOSIS — I69959 Hemiplegia and hemiparesis following unspecified cerebrovascular disease affecting unspecified side: Secondary | ICD-10-CM

## 2013-03-13 DIAGNOSIS — E1149 Type 2 diabetes mellitus with other diabetic neurological complication: Secondary | ICD-10-CM

## 2013-03-13 DIAGNOSIS — I10 Essential (primary) hypertension: Secondary | ICD-10-CM

## 2013-03-13 DIAGNOSIS — K219 Gastro-esophageal reflux disease without esophagitis: Secondary | ICD-10-CM

## 2013-03-13 NOTE — Progress Notes (Signed)
Patient ID: [name], female   DOB: 11/01/1945, 67 y.o.   MRN: [id number]  [place]  Allergies  Allergen Reactions  . Lotensin [Benazepril Hcl]      Chief Complaint  Patient presents with  . Medical Managment of Chronic Issues    HPI: She is being seen for the management of her chronic illnesses. She is due for cataract surgery on both eyes the left in Nov and the right in Dec. There are no concerns being voiced by the nursing staff at this time. She is not voicing any concerns at this time.   Past Medical History  Diagnosis Date  . HYPERTENSION 02/14/2006    Qualifier: Diagnosis of  By: Danae Chen    . PE 12/31/2008    Qualifier: Diagnosis of  By: Alexandria Lodge PharmD, Vonna Kotyk    . MITRAL REGURGITATION 05/23/2006    Annotation: S/P valve repair Qualifier: Diagnosis of  By: Danae Chen    . CARDIOMYOPATHY 05/23/2006    Annotation: Likely 2/2 EtOH, DM and HTN Qualifier: Diagnosis of  By: Danae Chen    . CONGESTIVE HEART FAILURE 02/14/2006    Qualifier: Diagnosis of  By: Danae Chen    . DVT 12/31/2008    Qualifier: Diagnosis of  By: Polly Cobia MD, Adwait    . Type II or unspecified type diabetes mellitus with neurological manifestations, not stated as uncontrolled(250.60) 02/23/2013  . PERIPHERAL NEUROPATHY 11/18/2006    Qualifier: Diagnosis of  By: Phillips Odor  DO, Beth    . DECUBITUS ULCER, BUTTOCK 02/14/2009    Qualifier: Diagnosis of  By: Eben Burow MD, Ankit    . RENAL INSUFFICIENCY 05/23/2006    Qualifier: Diagnosis of  By: Danae Chen    . HYPOKALEMIA 11/26/2006    Qualifier: Diagnosis of  By: Phillips Odor  DO, Beth    . PACEMAKER, PERMANENT 05/23/2006    Qualifier: Diagnosis of  By: Danae Chen      Past Surgical History  Procedure Laterality Date  . Pacemaker insertion      VITAL SIGNS BP 130/80  Pulse 80  Ht 5\' 6"  (1.676 m)  Wt 232 lb (105.235 kg)  BMI 37.46 kg/m2   Patient's Medications  New Prescriptions   No medications on file   Previous Medications   ASPIRIN 81 MG TABLET    Take 81 mg by mouth daily.   ATORVASTATIN (LIPITOR) 20 MG TABLET    Take 60 mg by mouth daily.   BETHANECHOL (URECHOLINE) 10 MG TABLET    Take 20 mg by mouth 3 (three) times daily.   CARVEDILOL (COREG) 25 MG TABLET    Take 25 mg by mouth 2 (two) times daily with a meal.   ERGOCALCIFEROL (VITAMIN D2) 50000 UNITS CAPSULE    Take 50,000 Units by mouth every 30 (thirty) days.   FUROSEMIDE (LASIX) 40 MG TABLET    Take 40 mg by mouth 2 (two) times daily.    INSULIN ASPART (NOVOLOG) 100 UNIT/ML INJECTION    Inject 5 Units into the skin 3 (three) times daily before meals. 5 units ac breakfast; and 7 units ac lunch and supper   INSULIN GLARGINE (LANTUS) 100 UNIT/ML INJECTION    Inject 25 Units into the skin at bedtime.   LOSARTAN (COZAAR) 50 MG TABLET    Take 50 mg by mouth daily.   METHYLPHENIDATE (RITALIN) 5 MG TABLET    Take one tablet by mouth every morning before breakfast   MULTIPLE VITAMIN (MULTIVITAMIN) TABLET  Take 1 tablet by mouth daily.   PANTOPRAZOLE (PROTONIX) 40 MG TABLET    Take 40 mg by mouth daily.    POLYETHYL GLYCOL-PROPYL GLYCOL (SYSTANE ULTRA OP)    Apply 1 drop to eye 3 (three) times daily. Both eyes   POTASSIUM CHLORIDE SA (K-DUR,KLOR-CON) 20 MEQ TABLET    Take 20 mEq by mouth daily.  Modified Medications   No medications on file  Discontinued Medications   No medications on file    SIGNIFICANT DIAGNOSTIC EXAMS  LABS REVIEWED:   03-28-12: liver normal albumin 3.7  05-30-12: wbc 9.3; hgb 11.0; hct 32.9; mcv 89.6; plt 239; glucose 96; bun 23; creat 1.52; k+ 4.0 Na++ 140; liver normal albumin 3.4; hgb a1c 6.7  08-18-12: chol 127; ldl 72; trig 92  10-06-12: wbc 7.3; hgb 10.0; hct 30.5 mcv 87.1; plt 232; glucose 46; bun 24; creat 1.51; k+4.2; na++143; liver normal albumin 3.4; hgb a1c 6.4; vit d 31 10-17-12: chol 127; ldl 76; trig 67  12-22-12: glucose 92; bun 24; creat 1.60; k+4.3; na++142  03-11-13: glucose 103; bun 27; creat  1.62; k+3.9; na++ 144; liver normal albumin 3.7; hgb a1c 6.8    Review of Systems  Constitutional: Negative.   Respiratory: Negative for cough and shortness of breath.   Cardiovascular: Negative for chest pain and leg swelling.  Gastrointestinal: Negative for heartburn, abdominal pain and constipation.  Musculoskeletal: Negative for myalgias, back pain and joint pain.  Skin: Negative.   Neurological: Negative for headaches.  Psychiatric/Behavioral: Negative for depression. The patient does not have insomnia.      Physical Exam  Constitutional: She is oriented to person, place, and time. She appears well-developed and well-nourished.  obese  Neck: Neck supple.  Cardiovascular: Normal rate, regular rhythm and intact distal pulses.   Respiratory: Effort normal.  GI: Soft. Bowel sounds are normal.  Musculoskeletal:  Out of bed to wheelchair left side hemiparesis; has left hand splint  Neurological: She is alert and oriented to person, place, and time.  Skin: Skin is warm and dry.  Psychiatric: She has a normal mood and affect.    ASSESSMENT/ PLAN:  1. Hypertension: she is stable will continue coreg 25 mg twice daily cozaar 50 mg daily and will monitor her status   2. Chf: she is stable will continue lasix 40 mg twice daily with k+ 20 meq daily and will monitor her status   3. Cva: she is without change in status will continue her asa 81 mg daily and will monitor her status.   4. Diabetes: she is stable will continue her lantus 25 units daily and novolog 5 units with breakfast and 7 units with lunch and supper will monitor  5. Gerd: will continue  protonix to 40 mg daily and will monitor  6. Dyslipidemia: will continue lipitor 60 mg daily; ldl is 76  7. Anxiety: will continue her ritalin 5 mg daily she is tolerating her wean at this time; will monitor her status   8. No recent ut's will continue urecholine 20 mg three times daily    Will get a urine for micro-albumin and  will monitor her status

## 2013-04-10 ENCOUNTER — Non-Acute Institutional Stay (SKILLED_NURSING_FACILITY): Payer: Medicare Other | Admitting: Adult Health

## 2013-04-10 DIAGNOSIS — I509 Heart failure, unspecified: Secondary | ICD-10-CM

## 2013-04-10 DIAGNOSIS — E785 Hyperlipidemia, unspecified: Secondary | ICD-10-CM

## 2013-04-10 DIAGNOSIS — I69959 Hemiplegia and hemiparesis following unspecified cerebrovascular disease affecting unspecified side: Secondary | ICD-10-CM

## 2013-04-10 DIAGNOSIS — E1149 Type 2 diabetes mellitus with other diabetic neurological complication: Secondary | ICD-10-CM

## 2013-04-10 DIAGNOSIS — N390 Urinary tract infection, site not specified: Secondary | ICD-10-CM

## 2013-04-10 DIAGNOSIS — K219 Gastro-esophageal reflux disease without esophagitis: Secondary | ICD-10-CM

## 2013-04-10 DIAGNOSIS — F411 Generalized anxiety disorder: Secondary | ICD-10-CM

## 2013-04-10 DIAGNOSIS — I10 Essential (primary) hypertension: Secondary | ICD-10-CM

## 2013-04-13 ENCOUNTER — Encounter: Payer: Self-pay | Admitting: Adult Health

## 2013-04-13 NOTE — Progress Notes (Signed)
Patient ID: [name], female   DOB: 22-Jul-1945, 67 y.o.   MRN: [id number]     [place]  Allergies  Allergen Reactions  . Lotensin [Benazepril Hcl]      Chief Complaint  Patient presents with  . Medical Managment of Chronic Issues    HPI:  She is being seen for the management of her chronic illnesses. There are no concerns being voiced by the nursing staff at this time. She is not voicing any complaints at this time. Overall there has been no change in her status over the recent past.    Past Medical History  Diagnosis Date  . HYPERTENSION 02/14/2006    Qualifier: Diagnosis of  By: Danae Chen    . PE 12/31/2008    Qualifier: Diagnosis of  By: Alexandria Lodge PharmD, Vonna Kotyk    . MITRAL REGURGITATION 05/23/2006    Annotation: S/P valve repair Qualifier: Diagnosis of  By: Danae Chen    . CARDIOMYOPATHY 05/23/2006    Annotation: Likely 2/2 EtOH, DM and HTN Qualifier: Diagnosis of  By: Danae Chen    . CONGESTIVE HEART FAILURE 02/14/2006    Qualifier: Diagnosis of  By: Danae Chen    . DVT 12/31/2008    Qualifier: Diagnosis of  By: Polly Cobia MD, Adwait    . Type II or unspecified type diabetes mellitus with neurological manifestations, not stated as uncontrolled(250.60) 02/23/2013  . PERIPHERAL NEUROPATHY 11/18/2006    Qualifier: Diagnosis of  By: Phillips Odor  DO, Beth    . DECUBITUS ULCER, BUTTOCK 02/14/2009    Qualifier: Diagnosis of  By: Eben Burow MD, Ankit    . RENAL INSUFFICIENCY 05/23/2006    Qualifier: Diagnosis of  By: Danae Chen    . HYPOKALEMIA 11/26/2006    Qualifier: Diagnosis of  By: Phillips Odor  DO, Beth    . PACEMAKER, PERMANENT 05/23/2006    Qualifier: Diagnosis of  By: Danae Chen     Past Surgical History  Procedure Laterality Date  . Pacemaker insertion    . Cataract extraction Bilateral     VITAL SIGNS BP 128/62  Pulse 69  Ht 5\' 6"  (1.676 m)  Wt 228 lb (103.42 kg)  BMI 36.82 kg/m2   Patient's Medications  New  Prescriptions   No medications on file  Previous Medications   ASPIRIN 81 MG TABLET    Take 81 mg by mouth daily.   ATORVASTATIN (LIPITOR) 20 MG TABLET    Take 60 mg by mouth daily.   BETHANECHOL (URECHOLINE) 10 MG TABLET    Take 20 mg by mouth 3 (three) times daily.   CARVEDILOL (COREG) 25 MG TABLET    Take 25 mg by mouth 2 (two) times daily with a meal.   ERGOCALCIFEROL (VITAMIN D2) 50000 UNITS CAPSULE    Take 50,000 Units by mouth every 30 (thirty) days.   ESOMEPRAZOLE (NEXIUM) 40 MG CAPSULE    Take 40 mg by mouth daily at 12 noon.   FUROSEMIDE (LASIX) 40 MG TABLET    Take 40 mg by mouth 2 (two) times daily.    INSULIN ASPART (NOVOLOG) 100 UNIT/ML INJECTION    Inject 5 Units into the skin 3 (three) times daily before meals. 5 units ac breakfast; and 7 units ac lunch and supper   INSULIN GLARGINE (LANTUS) 100 UNIT/ML INJECTION    Inject 25 Units into the skin at bedtime.   LOSARTAN (COZAAR) 50 MG TABLET    Take 50 mg by mouth daily.  METHYLPHENIDATE (RITALIN) 5 MG TABLET    Take one tablet by mouth every morning before breakfast   MULTIPLE VITAMIN (MULTIVITAMIN) TABLET    Take 1 tablet by mouth daily.   POLYETHYL GLYCOL-PROPYL GLYCOL (SYSTANE ULTRA OP)    Apply 1 drop to eye 3 (three) times daily. Both eyes   POTASSIUM CHLORIDE SA (K-DUR,KLOR-CON) 20 MEQ TABLET    Take 20 mEq by mouth daily.  Modified Medications   No medications on file  Discontinued Medications   PANTOPRAZOLE (PROTONIX) 40 MG TABLET    Take 40 mg by mouth daily.     SIGNIFICANT DIAGNOSTIC EXAMS   LABS REVIEWED:    05-30-12: wbc 9.3; hgb 11.0; hct 32.9; mcv 89.6; plt 239; glucose 96; bun 23; creat 1.52; k+ 4.0 Na++ 140; liver normal albumin 3.4; hgb a1c 6.7  08-18-12: chol 127; ldl 72; trig 92  10-06-12: wbc 7.3; hgb 10.0; hct 30.5 mcv 87.1; plt 232; glucose 46; bun 24; creat 1.51; k+4.2; na++143; liver normal albumin 3.4; hgb a1c 6.4; vit d 31 10-17-12: chol 127; ldl 76; trig 67  12-22-12: glucose 92; bun 24; creat  1.60; k+4.3; na++142  03-11-13: glucose 103; bun 27; creat 1.62; k+3.9; na++ 144; liver normal albumin 3.7; hgb a1c 6.8  03-18-13: mirco-albumin: 1.07    Review of Systems  Constitutional: Negative.   Respiratory: Negative for cough and shortness of breath.   Cardiovascular: Negative for chest pain and leg swelling.  Gastrointestinal: Negative for heartburn, abdominal pain and constipation.  Musculoskeletal: Negative for myalgias, back pain and joint pain.  Skin: Negative.   Neurological: Negative for headaches.  Psychiatric/Behavioral: Negative for depression. The patient does not have insomnia.      Physical Exam  Constitutional: She is oriented to person, place, and time. She appears well-developed and well-nourished.  obese  Neck: Neck supple.  Cardiovascular: Normal rate, regular rhythm and intact distal pulses.   Respiratory: Effort normal.  GI: Soft. Bowel sounds are normal.  Musculoskeletal:  Out of bed to wheelchair left side hemiparesis; has left hand splint  Neurological: She is alert and oriented to person, place, and time.  Skin: Skin is warm and dry.  Psychiatric: She has a normal mood and affect.    ASSESSMENT/ PLAN:  1. Hypertension: she is stable will continue coreg 25 mg twice daily cozaar 50 mg daily and will monitor her status   2. Chf: she is stable will continue lasix 40 mg twice daily with k+ 20 meq daily and will monitor her status   3. Cva: she is without change in status will continue her asa 81 mg daily and will monitor her status.   4. Diabetes: she is stable will continue her lantus 25 units daily and novolog 5 units with breakfast and 7 units with lunch and supper will monitor  5. Gerd: will continue  nexium  40 mg daily and will monitor  6. Dyslipidemia: will continue lipitor 60 mg daily; ldl is 76  7. Anxiety: will continue her ritalin 5 mg daily will not make changes; will monitor her status   8. No recent ut's will continue urecholine 20  mg three times daily

## 2013-05-08 ENCOUNTER — Other Ambulatory Visit: Payer: Self-pay | Admitting: *Deleted

## 2013-05-08 MED ORDER — METHYLPHENIDATE HCL 5 MG PO TABS: ORAL_TABLET | ORAL | Status: DC

## 2013-05-25 ENCOUNTER — Encounter: Payer: Self-pay | Admitting: Nurse Practitioner

## 2013-05-25 ENCOUNTER — Non-Acute Institutional Stay (SKILLED_NURSING_FACILITY): Payer: Medicare Other | Admitting: Nurse Practitioner

## 2013-05-25 DIAGNOSIS — E1149 Type 2 diabetes mellitus with other diabetic neurological complication: Secondary | ICD-10-CM

## 2013-05-25 DIAGNOSIS — N259 Disorder resulting from impaired renal tubular function, unspecified: Secondary | ICD-10-CM

## 2013-05-25 DIAGNOSIS — K219 Gastro-esophageal reflux disease without esophagitis: Secondary | ICD-10-CM

## 2013-05-25 DIAGNOSIS — E876 Hypokalemia: Secondary | ICD-10-CM

## 2013-05-25 DIAGNOSIS — I69959 Hemiplegia and hemiparesis following unspecified cerebrovascular disease affecting unspecified side: Secondary | ICD-10-CM

## 2013-05-25 DIAGNOSIS — I509 Heart failure, unspecified: Secondary | ICD-10-CM

## 2013-05-25 DIAGNOSIS — K5900 Constipation, unspecified: Secondary | ICD-10-CM

## 2013-05-25 DIAGNOSIS — E559 Vitamin D deficiency, unspecified: Secondary | ICD-10-CM

## 2013-05-25 DIAGNOSIS — I10 Essential (primary) hypertension: Secondary | ICD-10-CM

## 2013-05-25 NOTE — Progress Notes (Signed)
Patient ID: [name], female   DOB: 1945/06/15, 68 y.o.   MRN: [id number]    Nursing Home Location:  [place] [facility] Service: [facility] (31)  PCP: No primary provider on file.  Allergies  Allergen Reactions  . Lotensin [Benazepril Hcl]     Chief Complaint  Patient presents with  . Medical Managment of Chronic Issues    HPI:  68 year old female who is being seen today for routine follow up on chronic conditions; pt does well in current environment, nursing reports pt can not swallow potassium or MVI and pt confirms this; pt is without complaints today; staff also reports blood sugars run on the low side and question the amount of insulin she is getting.  Good appetite, no fevers or chills. No changes in functional status in the last month.    Review of Systems:  Review of Systems  Constitutional: Negative.   Respiratory: Negative for cough and shortness of breath.   Cardiovascular: Negative for chest pain and leg swelling.  Gastrointestinal: Negative for heartburn, abdominal pain and constipation.  Musculoskeletal: Negative for back pain, joint pain and myalgias.  Skin: Negative.   Neurological: Negative for headaches.  Psychiatric/Behavioral: Negative for depression. The patient does not have insomnia.      Past Medical History  Diagnosis Date  . HYPERTENSION 02/14/2006    Qualifier: Diagnosis of  By: Oretha Ellis    . PE 12/31/2008    Qualifier: Diagnosis of  By: Elie Confer PharmD, Ulice Dash    . MITRAL REGURGITATION 05/23/2006    Annotation: S/P valve repair Qualifier: Diagnosis of  By: Oretha Ellis    . CARDIOMYOPATHY 05/23/2006    Annotation: Likely 2/2 EtOH, DM and HTN Qualifier: Diagnosis of  By: Oretha Ellis    . CONGESTIVE HEART FAILURE 02/14/2006    Qualifier: Diagnosis of  By: Oretha Ellis    . DVT 12/31/2008    Qualifier: Diagnosis of  By: Caryn Section MD, Adwait    . Type II or unspecified type diabetes mellitus with  neurological manifestations, not stated as uncontrolled 02/23/2013  . PERIPHERAL NEUROPATHY 11/18/2006    Qualifier: Diagnosis of  By: Hilma Favors  DO, Beth    . DECUBITUS ULCER, BUTTOCK 02/14/2009    Qualifier: Diagnosis of  By: Cathren Laine MD, Ankit    . RENAL INSUFFICIENCY 05/23/2006    Qualifier: Diagnosis of  By: Oretha Ellis    . HYPOKALEMIA 11/26/2006    Qualifier: Diagnosis of  By: Hilma Favors  DO, Beth    . PACEMAKER, PERMANENT 05/23/2006    Qualifier: Diagnosis of  By: Oretha Ellis     Past Surgical History  Procedure Laterality Date  . Pacemaker insertion    . Cataract extraction Bilateral    Social History:   reports that she has quit smoking. She does not have any smokeless tobacco history on file. Her alcohol and drug histories are not on file.  No family history on file.  Medications: Patient's Medications  New Prescriptions   No medications on file  Previous Medications   ASPIRIN 81 MG TABLET    Take 81 mg by mouth daily.   ATORVASTATIN (LIPITOR) 20 MG TABLET    Take 60 mg by mouth daily.   BETHANECHOL (URECHOLINE) 10 MG TABLET    Take 20 mg by mouth 3 (three) times daily.   CARVEDILOL (COREG) 25 MG TABLET    Take 25 mg by mouth 2 (two) times daily  with a meal.   ERGOCALCIFEROL (VITAMIN D2) 50000 UNITS CAPSULE    Take 50,000 Units by mouth every 30 (thirty) days.   ESOMEPRAZOLE (NEXIUM) 40 MG CAPSULE    Take 40 mg by mouth daily at 12 noon.   FUROSEMIDE (LASIX) 40 MG TABLET    Take 40 mg by mouth 2 (two) times daily.    INSULIN ASPART (NOVOLOG) 100 UNIT/ML INJECTION    Inject 5 Units into the skin 3 (three) times daily before meals. 5 units ac breakfast; and 7 units ac lunch and supper   INSULIN GLARGINE (LANTUS) 100 UNIT/ML INJECTION    Inject 25 Units into the skin at bedtime.   LOSARTAN (COZAAR) 50 MG TABLET    Take 50 mg by mouth daily.   METHYLPHENIDATE (RITALIN) 5 MG TABLET    Take one tablet by mouth every morning before breakfast   MULTIPLE VITAMIN  (MULTIVITAMIN) TABLET    Take 1 tablet by mouth daily.   POLYETHYL GLYCOL-PROPYL GLYCOL (SYSTANE ULTRA OP)    Apply 1 drop to eye 3 (three) times daily. Both eyes   POTASSIUM CHLORIDE SA (K-DUR,KLOR-CON) 20 MEQ TABLET    Take 20 mEq by mouth daily.  Modified Medications   No medications on file  Discontinued Medications   No medications on file     Physical Exam: Physical Exam  Constitutional: She is well-developed, well-nourished, and in no distress.  HENT:  Head: Normocephalic and atraumatic.  Mouth/Throat: Oropharynx is clear and moist. No oropharyngeal exudate.  Eyes: Conjunctivae and EOM are normal. Pupils are equal, round, and reactive to light.  Neck: Normal range of motion. Neck supple. No thyromegaly present.  Cardiovascular: Normal rate, regular rhythm and normal heart sounds.   Pulmonary/Chest: Effort normal and breath sounds normal.  Abdominal: Soft. Bowel sounds are normal. She exhibits no distension.  Musculoskeletal: She exhibits no edema and no tenderness.  Lymphadenopathy:    She has no cervical adenopathy.  Neurological: She is alert.  Left sided hemiparesis   Skin: Skin is warm and dry.  ;  Filed Vitals:   05/25/13 1406  BP: 106/60  Pulse: 70  Temp: 98.7 F (37.1 C)  Resp: 19      Labs reviewed: -24-14: wbc 9.3; hgb 11.0; hct 32.9; mcv 89.6; plt 239; glucose 96; bun 23; creat 1.52; k+ 4.0  Na++ 140; liver normal albumin 3.4; hgb a1c 6.7  08-18-12: chol 127; ldl 72; trig 92  10-06-12: wbc 7.3; hgb 10.0; hct 30.5 mcv 87.1; plt 232; glucose 46; bun 24; creat 1.51; k+4.2; na++143; liver normal albumin 3.4; hgb a1c 6.4; vit d 31  10-17-12: chol 127; ldl 76; trig 67  12-22-12: glucose 92; bun 24; creat 1.60; k+4.3; na++142  03-11-13: glucose 103; bun 27; creat 1.62; k+3.9; na++ 144; liver normal albumin 3.7; hgb a1c 6.8  03-18-13: mirco-albumin: 1.07   Assessment/Plan 1. CONGESTIVE HEART FAILURE -stable at this time; conts lasix and potassium   2.  HYPERTENSION -Patient is stable; continue current regimen. Will monitor and make changes as necessary.  3. CONSTIPATION NOS -currently without complaints   4. GERD (gastroesophageal reflux disease) -controled on nexium   5. Type II or unspecified type diabetes mellitus with neurological manifestations, not stated as uncontrolled(250.60) -A1c in nov was at goal; pt with low blood sugars at times -will stop novolog and cont to monitor  6. CVA WITH LEFT HEMIPARESIS - stable; conts on ASA -will follow up cbc  7. Unspecified vitamin D deficiency -taking vit D  will follow up vit d level   8. RENAL INSUFFICIENCY -will follow up blood work  9. HYPOKALEMIA -due to lasix use however unable to take potassium , staff reports they have tired elixer, tablet and she can not take medication without gaging  -pt reports she never is able to take this -will stop potassium and check bmp

## 2013-06-08 ENCOUNTER — Other Ambulatory Visit: Payer: Self-pay | Admitting: *Deleted

## 2013-06-08 MED ORDER — METHYLPHENIDATE HCL 5 MG PO TABS: ORAL_TABLET | ORAL | Status: DC

## 2013-06-08 NOTE — Telephone Encounter (Signed)
[facility] 

## 2013-06-29 ENCOUNTER — Non-Acute Institutional Stay (SKILLED_NURSING_FACILITY): Payer: Medicare Other | Admitting: Nurse Practitioner

## 2013-06-29 ENCOUNTER — Encounter: Payer: Self-pay | Admitting: Nurse Practitioner

## 2013-06-29 DIAGNOSIS — I69959 Hemiplegia and hemiparesis following unspecified cerebrovascular disease affecting unspecified side: Secondary | ICD-10-CM

## 2013-06-29 DIAGNOSIS — K5900 Constipation, unspecified: Secondary | ICD-10-CM

## 2013-06-29 DIAGNOSIS — K219 Gastro-esophageal reflux disease without esophagitis: Secondary | ICD-10-CM

## 2013-06-29 DIAGNOSIS — I10 Essential (primary) hypertension: Secondary | ICD-10-CM

## 2013-06-29 DIAGNOSIS — I509 Heart failure, unspecified: Secondary | ICD-10-CM

## 2013-06-29 DIAGNOSIS — E1149 Type 2 diabetes mellitus with other diabetic neurological complication: Secondary | ICD-10-CM

## 2013-06-29 DIAGNOSIS — E559 Vitamin D deficiency, unspecified: Secondary | ICD-10-CM

## 2013-06-29 DIAGNOSIS — E785 Hyperlipidemia, unspecified: Secondary | ICD-10-CM

## 2013-06-29 DIAGNOSIS — G609 Hereditary and idiopathic neuropathy, unspecified: Secondary | ICD-10-CM

## 2013-06-29 NOTE — Progress Notes (Signed)
Patient ID: [name], female   DOB: 01/07/1946, 68 y.o.   MRN: [id number]    Nursing Home Location:  [place] of Service: SNF (31)  PCP: No primary provider on file.  Allergies  Allergen Reactions  . Lotensin [Benazepril Hcl]     Chief Complaint  Patient presents with  . Medical Managment of Chronic Issues    HPI:  HPI: Seeing patient today for routine visit of multiple medical problems and medical management including CHF, DM, GIB, HLD, and renal insufficiency. Patient has been in usual state of health with no new complaints or concerns. No nursing report of new issues. Patient continues routine OT, ST, and PT for long-term management of post CVA needs. Review of Systems:  General: Patient In bed without complaint. Denies pain or complaints.  HEENT: No visual or auditory changes. Denies sinus pain or pressure. Denies swollen lymph nodes or sore throat. Denies oral or dental pain. Respiratory: Denies SOB, cough, or wheeze. CV: Denies CP, pressure. No report of arrhythmia or syncope. Some chronic edema to LLE reported by therapy staff. GI/GU: Is able to feed self. No significant documented weight loss. Denies N/V/D. Denies abdominal pain or fullness. Denies constipation. Denies dysuria. Neuro: No neurocognitive complaints. Denies confusion or new memory loss. MS: No recent injury or fall. Denies joint pain or swelling. Denies leg pain. Integ: Denies rash, lesion, or wound. PU to left 5th small finger healed according to OT staff.    Past Medical History  Diagnosis Date  . HYPERTENSION 02/14/2006    Qualifier: Diagnosis of  By: Oretha Ellis    . PE 12/31/2008    Qualifier: Diagnosis of  By: Elie Confer PharmD, Ulice Dash    . MITRAL REGURGITATION 05/23/2006    Annotation: S/P valve repair Qualifier: Diagnosis of  By: Oretha Ellis    . CARDIOMYOPATHY 05/23/2006    Annotation: Likely 2/2 EtOH, DM and HTN Qualifier: Diagnosis of  By: Oretha Ellis    . CONGESTIVE HEART FAILURE 02/14/2006    Qualifier: Diagnosis of  By: Oretha Ellis    . DVT 12/31/2008    Qualifier: Diagnosis of  By: Caryn Section MD, Adwait    . Type II or unspecified type diabetes mellitus with neurological manifestations, not stated as uncontrolled 02/23/2013  . PERIPHERAL NEUROPATHY 11/18/2006    Qualifier: Diagnosis of  By: Hilma Favors  DO, Beth    . DECUBITUS ULCER, BUTTOCK 02/14/2009    Qualifier: Diagnosis of  By: Cathren Laine MD, Ankit    . RENAL INSUFFICIENCY 05/23/2006    Qualifier: Diagnosis of  By: Oretha Ellis    . HYPOKALEMIA 11/26/2006    Qualifier: Diagnosis of  By: Hilma Favors  DO, Beth    . PACEMAKER, PERMANENT 05/23/2006    Qualifier: Diagnosis of  By: Oretha Ellis     Past Surgical History  Procedure Laterality Date  . Pacemaker insertion    . Cataract extraction Bilateral    Social History:   reports that she has quit smoking. She does not have any smokeless tobacco history on file. Her alcohol and drug histories are not on file.  No family history on file.  Medications: Patient's Medications  New Prescriptions   No medications on file  Previous Medications   ASPIRIN 81 MG TABLET    Take 81 mg by mouth daily.   ATORVASTATIN (LIPITOR) 20 MG TABLET    Take 60 mg by mouth daily.   BETHANECHOL (URECHOLINE) 10  MG TABLET    Take 20 mg by mouth 3 (three) times daily.   CARVEDILOL (COREG) 25 MG TABLET    Take 25 mg by mouth 2 (two) times daily with a meal.   ERGOCALCIFEROL (VITAMIN D2) 50000 UNITS CAPSULE    Take 50,000 Units by mouth every 30 (thirty) days.   ESOMEPRAZOLE (NEXIUM) 40 MG CAPSULE    Take 40 mg by mouth daily at 12 noon.   FUROSEMIDE (LASIX) 40 MG TABLET    Take 40 mg by mouth 2 (two) times daily.    INSULIN ASPART (NOVOLOG) 100 UNIT/ML INJECTION    Inject 5 Units into the skin 3 (three) times daily before meals. 5 units ac breakfast; and 7 units ac lunch and supper   INSULIN GLARGINE (LANTUS) 100 UNIT/ML INJECTION     Inject 25 Units into the skin at bedtime.   LOSARTAN (COZAAR) 50 MG TABLET    Take 50 mg by mouth daily.   METHYLPHENIDATE (RITALIN) 5 MG TABLET    Take one tablet by mouth every morning before breakfast   MULTIPLE VITAMIN (MULTIVITAMIN) TABLET    Take 1 tablet by mouth daily.   POLYETHYL GLYCOL-PROPYL GLYCOL (SYSTANE ULTRA OP)    Apply 1 drop to eye 3 (three) times daily. Both eyes   POTASSIUM CHLORIDE SA (K-DUR,KLOR-CON) 20 MEQ TABLET    Take 20 mEq by mouth daily.  Modified Medications   No medications on file  Discontinued Medications   No medications on file     Physical Exam:  Filed Vitals:   06/29/13 1055  BP: 156/74  Pulse: 72  Temp: 97.9 F (36.6 C)  Resp: 16  Weight: 236 lb (107.049 kg)    General: Female patient in NAD, in bed. Cooperative with exam; flat affect.Marland Kitchen HEENT: PERRL. No drainage or inflammation to conjunctiva. Oral mucosa smooth, smooth, and dry. Lymph nodes without swelling or tenderness.  Respiratory: Respirations even and unlabored. No use of accessory muscles. Lungs CTA bilaterally without wheeze, rhoncii, or crackles, left upper lobe with decreased breath sounds.. No cough noted. CV: No JVD present. S1 and S2 auscultated, slightly irregular. 1-2+ mildly pitting pedal edema noted bilaterally, left > right. GI/GU: Soft obese abdomen without pulsation. Sl hypoactive bowel sounds x 4 quads.. No abdominal tenderness or guarding. No organomegaly. No bladder distention or CVA tenderness. MS: Left hand splint in use; patient tolerating well. Chronic contracture noted to left hand and fingers. Patient up to [facility] with assist x 1. Left LE paresis noted.  Integ: Skin dry, turgor WNL. No rash, lesion, or new pressure ulcer noted.      Labs reviewed: -24-14: wbc 9.3; hgb 11.0; hct 32.9; mcv 89.6; plt 239; glucose 96; bun 23; creat 1.52; k+ 4.0  Na++ 140; liver normal albumin 3.4; hgb a1c 6.7  08-18-12: chol 127; ldl 72; trig 92  10-06-12: wbc 7.3; hgb 10.0; hct 30.5  mcv 87.1; plt 232; glucose 46; bun 24; creat 1.51; k+4.2; na++143; liver normal albumin 3.4; hgb a1c 6.4; vit d 31  10-17-12: chol 127; ldl 76; trig 67  12-22-12: glucose 92; bun 24; creat 1.60; k+4.3; na++142  03-11-13: glucose 103; bun 27; creat 1.62; k+3.9; na++ 144; liver normal albumin 3.7; hgb a1c 6.8  03-18-13: mirco-albumin: 1.07  CBC with Diff    Result: 05/27/2013 11:59 AM   ( Status: F )     C WBC 8.9     4.0-10.5 K/uL SLN   RBC 3.89     3.87-5.11 MIL/uL  SLN   Hemoglobin 11.2   L 12.0-15.0 g/dL SLN   Hematocrit 33.9   L 36.0-46.0 % SLN   MCV 87.1     78.0-100.0 fL SLN   MCH 28.8     26.0-34.0 pg SLN   MCHC 33.0     30.0-36.0 g/dL SLN   RDW 13.9     11.5-15.5 % SLN   Platelet Count 250     150-400 K/uL SLN   Granulocyte % 64     43-77 % SLN   Absolute Gran 5.7     1.7-7.7 K/uL SLN   Lymph % 27     12-46 % SLN   Absolute Lymph 2.4     0.7-4.0 K/uL SLN   Mono % 6     3-12 % SLN   Absolute Mono 0.5     0.1-1.0 K/uL SLN   Eos % 3     0-5 % SLN   Absolute Eos 0.3     0.0-0.7 K/uL SLN   Baso % 0     0-1 % SLN   Absolute Baso 0.0     0.0-0.1 K/uL SLN   Smear Review Criteria for review not met  SLN   Basic Metabolic Panel    Result: 05/27/2013 12:49 PM   ( Status: F )       Sodium 139     135-145 mEq/L SLN   Potassium 3.8     3.5-5.3 mEq/L SLN   Chloride 102     96-112 mEq/L SLN   CO2 28     19-32 mEq/L SLN   Glucose 112   H 70-99 mg/dL SLN   BUN 25   H 6-23 mg/dL SLN   Creatinine 1.51   H 0.50-1.10 mg/dL SLN   Calcium 9.0     8.4-10.5 mg/dL SLN   Vitamin D (25-Hydroxy)    Result: 05/28/2013 2:21 AM   ( Status: F )       Vitamin D (25-Hydroxy) 38     30-89 ng/mL SLN C Assessment/Plan  1. Unspecified vitamin D deficiency Continue Vitamin D supplementation monthly and follow Vitamin D levels on routine basis.  2. HYPERLIPIDEMIA HLD controlled with current regimen. Will continue Zocor 60 mg po @ hs. Monitor Lipid panel routinely. 3. CVA WITH LEFT HEMIPARESIS Continue  supportive care. Continue therapeutic interventions for short and long-term goals. Continue EC asa 81 mg daily. 4. PERIPHERAL NEUROPATHY No pain control issues. Will continue to monitor for needs related to neuropathy and treat accordingly. 5. GERD (gastroesophageal reflux disease) Gerd symptoms stable. Continue Omeprazole 91m cps daily. 6. Type II or unspecified type diabetes mellitus with neurological manifestations, not stated as uncontrolled(250.60) FSBS stable on current regimen. Will continue Lantus 25 u every other night. Tolerating d/c of Novolog. Fasting FSBS <150. Last HgbAIC <7.  7. CONSTIPATION NOS No recent constipation, will continue to monitor.  8. CONGESTIVE HEART FAILURE Mild peripheral edema improved, continue Lasix 424mBID. Potassium low normal; continue to monitor closely since d/c of Potassium.  9. HYPERTENSION BP  Some elevation on review; Will continue Cozaar and Coreg at current dosages--and will add norvasc 5 mg daily-- will have staff monitor for worsening LE edema; will add TED hose to prevent this.

## 2013-07-22 ENCOUNTER — Other Ambulatory Visit: Payer: Self-pay | Admitting: *Deleted

## 2013-07-22 MED ORDER — METHYLPHENIDATE HCL 5 MG PO TABS: ORAL_TABLET | ORAL | Status: DC

## 2013-07-22 NOTE — Telephone Encounter (Signed)
[facility] 

## 2013-07-27 ENCOUNTER — Non-Acute Institutional Stay (SKILLED_NURSING_FACILITY): Payer: Medicare Other | Admitting: Nurse Practitioner

## 2013-07-27 DIAGNOSIS — E785 Hyperlipidemia, unspecified: Secondary | ICD-10-CM

## 2013-07-27 DIAGNOSIS — E559 Vitamin D deficiency, unspecified: Secondary | ICD-10-CM

## 2013-07-27 DIAGNOSIS — I509 Heart failure, unspecified: Secondary | ICD-10-CM

## 2013-07-27 DIAGNOSIS — I10 Essential (primary) hypertension: Secondary | ICD-10-CM

## 2013-07-27 DIAGNOSIS — E1149 Type 2 diabetes mellitus with other diabetic neurological complication: Secondary | ICD-10-CM

## 2013-07-27 NOTE — Progress Notes (Signed)
Patient ID: [name], female   DOB: January 07, 1946, 68 y.o.   MRN: [id number]    Nursing Home Location:  [place] of Service: SNF (31)  PCP: No primary provider on file.  Allergies  Allergen Reactions  . Lotensin [Benazepril Hcl]     Chief Complaint  Patient presents with  . Medical Managment of Chronic Issues    HPI:  68 year old female seen today for routine visit of multiple medical problems and medical management including CHF, DM, GIB, HLD, and renal insufficiency. Patient has been in usual state of health in the last month with no new complaints or concerns. No nursing report of new issues per staff.   Review of Systems:  Review of Systems  Constitutional: Negative.   Respiratory: Negative for cough and shortness of breath.   Cardiovascular: Negative for chest pain and leg swelling.  Gastrointestinal: Negative for heartburn, abdominal pain and constipation.  Genitourinary: Negative for dysuria.  Musculoskeletal: Negative for back pain, joint pain and myalgias.  Skin: Negative.   Neurological: Negative for dizziness and headaches.  Psychiatric/Behavioral: Negative for depression. The patient does not have insomnia.      Past Medical History  Diagnosis Date  . HYPERTENSION 02/14/2006    Qualifier: Diagnosis of  By: Oretha Ellis    . PE 12/31/2008    Qualifier: Diagnosis of  By: Elie Confer PharmD, Ulice Dash    . MITRAL REGURGITATION 05/23/2006    Annotation: S/P valve repair Qualifier: Diagnosis of  By: Oretha Ellis    . CARDIOMYOPATHY 05/23/2006    Annotation: Likely 2/2 EtOH, DM and HTN Qualifier: Diagnosis of  By: Oretha Ellis    . CONGESTIVE HEART FAILURE 02/14/2006    Qualifier: Diagnosis of  By: Oretha Ellis    . DVT 12/31/2008    Qualifier: Diagnosis of  By: Caryn Section MD, Adwait    . Type II or unspecified type diabetes mellitus with neurological manifestations, not stated as uncontrolled 02/23/2013  . PERIPHERAL  NEUROPATHY 11/18/2006    Qualifier: Diagnosis of  By: Hilma Favors  DO, Beth    . DECUBITUS ULCER, BUTTOCK 02/14/2009    Qualifier: Diagnosis of  By: Cathren Laine MD, Ankit    . RENAL INSUFFICIENCY 05/23/2006    Qualifier: Diagnosis of  By: Oretha Ellis    . HYPOKALEMIA 11/26/2006    Qualifier: Diagnosis of  By: Hilma Favors  DO, Beth    . PACEMAKER, PERMANENT 05/23/2006    Qualifier: Diagnosis of  By: Oretha Ellis     Past Surgical History  Procedure Laterality Date  . Pacemaker insertion    . Cataract extraction Bilateral    Social History:   reports that she has quit smoking. She does not have any smokeless tobacco history on file. Her alcohol and drug histories are not on file.  No family history on file.  Medications: Patient's Medications  New Prescriptions   No medications on file  Previous Medications   AMLODIPINE (NORVASC) 5 MG TABLET    Take 5 mg by mouth daily.   ASPIRIN 81 MG TABLET    Take 81 mg by mouth daily.   ATORVASTATIN (LIPITOR) 20 MG TABLET    Take 60 mg by mouth daily.   BETHANECHOL (URECHOLINE) 10 MG TABLET    Take 20 mg by mouth 3 (three) times daily.   CARVEDILOL (COREG) 25 MG TABLET    Take 25 mg by mouth 2 (two) times daily with a meal.  ERGOCALCIFEROL (VITAMIN D2) 50000 UNITS CAPSULE    Take 50,000 Units by mouth every 30 (thirty) days.   ESOMEPRAZOLE (NEXIUM) 40 MG CAPSULE    Take 40 mg by mouth daily at 12 noon.   FUROSEMIDE (LASIX) 40 MG TABLET    Take 40 mg by mouth 2 (two) times daily.    INSULIN GLARGINE (LANTUS) 100 UNIT/ML INJECTION    Inject 25 Units into the skin at bedtime.   LOSARTAN (COZAAR) 50 MG TABLET    Take 50 mg by mouth daily.   METHYLPHENIDATE (RITALIN) 5 MG TABLET    Take one tablet by mouth every morning before breakfast   POLYETHYL GLYCOL-PROPYL GLYCOL (SYSTANE ULTRA OP)    Apply 1 drop to eye 3 (three) times daily. Both eyes  Modified Medications   No medications on file  Discontinued Medications   INSULIN ASPART (NOVOLOG) 100  UNIT/ML INJECTION    Inject 5 Units into the skin 3 (three) times daily before meals. 5 units ac breakfast; and 7 units ac lunch and supper     Physical Exam:  Filed Vitals:   07/27/13 0951  BP: 118/76  Pulse: 72  Temp: 97.6 F (36.4 C)  Resp: 18   Physical Exam  Constitutional: She appears well-developed and well-nourished.  HENT:  Head: Normocephalic and atraumatic.  Right Ear: External ear normal.  Left Ear: External ear normal.  Mouth/Throat: Oropharynx is clear and moist. No oropharyngeal exudate.  Eyes: Conjunctivae and EOM are normal. Pupils are equal, round, and reactive to light.  Neck: Normal range of motion. Neck supple. No thyromegaly present.  Cardiovascular: Normal rate, regular rhythm and normal heart sounds.   Pulmonary/Chest: Effort normal and breath sounds normal. No respiratory distress.  Abdominal: Soft. Bowel sounds are normal. She exhibits no distension.  Musculoskeletal: Normal range of motion. She exhibits no edema and no tenderness.  In [facility]; left sided hemiplegia   Lymphadenopathy:    She has no cervical adenopathy.  Neurological: She is alert.  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 She is not diaphoretic. No erythema.  Psychiatric: She has a normal mood and affect.      Labs reviewed: 08-18-12: chol 127; ldl 72; trig 92  10-06-12: wbc 7.3; hgb 10.0; hct 30.5 mcv 87.1; plt 232; glucose 46; bun 24; creat 1.51; k+4.2; na++143; liver normal albumin 3.4; hgb a1c 6.4; vit d 31  10-17-12: chol 127; ldl 76; trig 67  12-22-12: glucose 92; bun 24; creat 1.60; k+4.3; na++142  03-11-13: glucose 103; bun 27; creat 1.62; k+3.9; na++ 144; liver normal albumin 3.7; hgb a1c 6.8  03-18-13: mirco-albumin: 1.07  CBC with Diff  Result: 05/27/2013 11:59 AM ( Status: F ) C  WBC 8.9 4.0-10.5 K/uL SLN  RBC 3.89 3.87-5.11 MIL/uL SLN  Hemoglobin 11.2 L 12.0-15.0 g/dL SLN  Hematocrit 33.9 L 36.0-46.0 % SLN  MCV 87.1 78.0-100.0 fL SLN  MCH 28.8  26.0-34.0 pg SLN  MCHC 33.0 30.0-36.0 g/dL SLN  RDW 13.9 11.5-15.5 % SLN  Platelet Count 250 150-400 K/uL SLN  Granulocyte % 64 43-77 % SLN  Absolute Gran 5.7 1.7-7.7 K/uL SLN  Lymph % 27 12-46 % SLN  Absolute Lymph 2.4 0.7-4.0 K/uL SLN  Mono % 6 3-12 % SLN  Absolute Mono 0.5 0.1-1.0 K/uL SLN  Eos % 3 0-5 % SLN  Absolute Eos 0.3 0.0-0.7 K/uL SLN  Baso % 0 0-1 % SLN  Absolute Baso 0.0 0.0-0.1 K/uL SLN  Smear Review Criteria  for review not met SLN  Basic Metabolic Panel  Result: 05/12/2692 12:49 PM ( Status: F )  Sodium 139 135-145 mEq/L SLN  Potassium 3.8 3.5-5.3 mEq/L SLN  Chloride 102 96-112 mEq/L SLN  CO2 28 19-32 mEq/L SLN  Glucose 112 H 70-99 mg/dL SLN  BUN 25 H 6-23 mg/dL SLN  Creatinine 1.51 H 0.50-1.10 mg/dL SLN  Calcium 9.0 8.4-10.5 mg/dL SLN  Vitamin D (25-Hydroxy)  Result: 05/28/2013 2:21 AM ( Status: F )  Vitamin D (25-Hydroxy) 38 30-89 ng/mL SLN C   Assessment/Plan 1. CONGESTIVE HEART FAILURE -overall stable, continues Lasix $RemoveBeforeD'40mg'[id number]$  BID without potassium due to unable to swallow medication, will follow up cmp  2. HYPERTENSION - better blood pressure control with addition of Norvas, no increase in edema noted, pt tolerating medication. Cont coreg, cozaar and norvasc   3. Type II or unspecified type diabetes mellitus with neurological manifestations, not stated as uncontrolled(250.60) -blood sugars under good control on review; will follow up A1c  4. Unspecified vitamin D deficiency - conts to take vit d once monthly, level with appropriate range in 05/2013  5. HYPERLIPIDEMIA - conts on Lipitor 60; will follow up fasting lipids

## 2013-07-29 LAB — LIPID PANEL
Cholesterol: 112 mg/dL (ref 0–200)
HDL: 39 mg/dL (ref 35–70)
LDL Cholesterol: 63 mg/dL
TRIGLYCERIDES: 49 mg/dL (ref 40–160)

## 2013-08-18 ENCOUNTER — Other Ambulatory Visit: Payer: Self-pay | Admitting: *Deleted

## 2013-08-18 MED ORDER — METHYLPHENIDATE HCL 5 MG PO TABS: ORAL_TABLET | ORAL | Status: DC

## 2013-08-18 NOTE — Telephone Encounter (Signed)
[facility] 

## 2013-08-24 ENCOUNTER — Non-Acute Institutional Stay (SKILLED_NURSING_FACILITY): Payer: Medicare Other | Admitting: Nurse Practitioner

## 2013-08-24 ENCOUNTER — Encounter: Payer: Self-pay | Admitting: Nurse Practitioner

## 2013-08-24 DIAGNOSIS — I69959 Hemiplegia and hemiparesis following unspecified cerebrovascular disease affecting unspecified side: Secondary | ICD-10-CM

## 2013-08-24 DIAGNOSIS — I509 Heart failure, unspecified: Secondary | ICD-10-CM

## 2013-08-24 DIAGNOSIS — K219 Gastro-esophageal reflux disease without esophagitis: Secondary | ICD-10-CM

## 2013-08-24 DIAGNOSIS — I10 Essential (primary) hypertension: Secondary | ICD-10-CM

## 2013-08-24 DIAGNOSIS — E785 Hyperlipidemia, unspecified: Secondary | ICD-10-CM

## 2013-08-24 DIAGNOSIS — E1149 Type 2 diabetes mellitus with other diabetic neurological complication: Secondary | ICD-10-CM

## 2013-08-24 NOTE — Progress Notes (Signed)
Patient ID: [name], female   DOB: [birth year], 68 y.o.   MRN: [id number]    Nursing Home Location:  [place] of Service: SNF (31)  PCP: No primary provider on file.  Allergies  Allergen Reactions  . Lotensin [Benazepril Hcl]     Chief Complaint  Patient presents with  . Medical Management of Chronic Issues    HPI:  68 year old female seen today for routine visit of multiple medical problems and medical management including CHF, DM, GIB, HLD, and renal insufficiency. Patient has been in usual state of health in the last month with no significant changes in chronic conditions. No nursing report of new issues per staff.   Review of Systems:  Review of Systems  Constitutional: Negative.  Negative for fever, chills and malaise/fatigue.  Respiratory: Negative for cough and shortness of breath.   Cardiovascular: Negative for chest pain and leg swelling.  Gastrointestinal: Negative for heartburn, abdominal pain and constipation.  Genitourinary: Negative for dysuria.  Musculoskeletal: Negative for back pain, joint pain and myalgias.  Skin: Negative.   Neurological: Negative for dizziness, weakness and headaches.  Psychiatric/Behavioral: Negative for depression. The patient does not have insomnia.      Past Medical History  Diagnosis Date  . HYPERTENSION 02/14/2006    Qualifier: Diagnosis of  By: Oretha Ellis    . PE 12/31/2008    Qualifier: Diagnosis of  By: Elie Confer PharmD, Ulice Dash    . MITRAL REGURGITATION 05/23/2006    Annotation: S/P valve repair Qualifier: Diagnosis of  By: Oretha Ellis    . CARDIOMYOPATHY 05/23/2006    Annotation: Likely 2/2 EtOH, DM and HTN Qualifier: Diagnosis of  By: Oretha Ellis    . CONGESTIVE HEART FAILURE 02/14/2006    Qualifier: Diagnosis of  By: Oretha Ellis    . DVT 12/31/2008    Qualifier: Diagnosis of  By: Caryn Section MD, Adwait    . Type II or unspecified type diabetes mellitus with neurological  manifestations, not stated as uncontrolled 02/23/2013  . PERIPHERAL NEUROPATHY 11/18/2006    Qualifier: Diagnosis of  By: Hilma Favors  DO, Beth    . DECUBITUS ULCER, BUTTOCK 02/14/2009    Qualifier: Diagnosis of  By: Cathren Laine MD, Ankit    . RENAL INSUFFICIENCY 05/23/2006    Qualifier: Diagnosis of  By: Oretha Ellis    . HYPOKALEMIA 11/26/2006    Qualifier: Diagnosis of  By: Hilma Favors  DO, Beth    . PACEMAKER, PERMANENT 05/23/2006    Qualifier: Diagnosis of  By: Oretha Ellis     Past Surgical History  Procedure Laterality Date  . Pacemaker insertion    . Cataract extraction Bilateral    Social History:   reports that she has quit smoking. She does not have any smokeless tobacco history on file. Her alcohol and drug histories are not on file.  No family history on file.  Medications: Patient's Medications  New Prescriptions   No medications on file  Previous Medications   AMLODIPINE (NORVASC) 5 MG TABLET    Take 5 mg by mouth daily.   ASPIRIN 81 MG TABLET    Take 81 mg by mouth daily.   ATORVASTATIN (LIPITOR) 20 MG TABLET    Take 60 mg by mouth daily.   BETHANECHOL (URECHOLINE) 10 MG TABLET    Take 20 mg by mouth 3 (three) times daily.   CARVEDILOL (COREG) 25 MG TABLET    Take 25 mg by  mouth 2 (two) times daily with a meal.   ERGOCALCIFEROL (VITAMIN D2) 50000 UNITS CAPSULE    Take 50,000 Units by mouth every 30 (thirty) days.   ESOMEPRAZOLE (NEXIUM) 40 MG CAPSULE    Take 40 mg by mouth daily at 12 noon.   FUROSEMIDE (LASIX) 40 MG TABLET    Take 40 mg by mouth 2 (two) times daily.    INSULIN GLARGINE (LANTUS) 100 UNIT/ML INJECTION    Inject 25 Units into the skin at bedtime.   LOSARTAN (COZAAR) 50 MG TABLET    Take 50 mg by mouth daily.   METHYLPHENIDATE (RITALIN) 5 MG TABLET    Take one tablet by mouth every morning before breakfast   POLYETHYL GLYCOL-PROPYL GLYCOL (SYSTANE ULTRA OP)    Apply 1 drop to eye 3 (three) times daily. Both eyes  Modified Medications   No medications  on file  Discontinued Medications   No medications on file     Physical Exam:  Filed Vitals:   08/24/13 1240  BP: 124/62  Pulse: 78  Temp: 98.8 F (37.1 C)  Resp: 20   Physical Exam  Constitutional: She is well-developed, well-nourished, and in no distress.  HENT:  Head: Normocephalic and atraumatic.  Mouth/Throat: Oropharynx is clear and moist. No oropharyngeal exudate.  Eyes: Conjunctivae and EOM are normal. Pupils are equal, round, and reactive to light.  Neck: Normal range of motion. Neck supple.  Cardiovascular: Normal rate, regular rhythm and normal heart sounds.   Pulmonary/Chest: Effort normal and breath sounds normal.  Abdominal: Soft. Bowel sounds are normal. She exhibits no distension.  Musculoskeletal: She exhibits no edema and no tenderness.  Self propels in [facility]  Neurological: She is alert.  Left sided hemiparesis   Skin: Skin is warm and dry.  Psychiatric: Affect normal.      Labs reviewed: 08-18-12: chol 127; ldl 72; trig 92  10-06-12: wbc 7.3; hgb 10.0; hct 30.5 mcv 87.1; plt 232; glucose 46; bun 24; creat 1.51; k+4.2; na++143; liver normal albumin 3.4; hgb a1c 6.4; vit d 31  10-17-12: chol 127; ldl 76; trig 67  12-22-12: glucose 92; bun 24; creat 1.60; k+4.3; na++142  03-11-13: glucose 103; bun 27; creat 1.62; k+3.9; na++ 144; liver normal albumin 3.7; hgb a1c 6.8  03-18-13: mirco-albumin: 1.07  CBC with Diff  Result: 05/27/2013 11:59 AM ( Status: F ) C  WBC 8.9 4.0-10.5 K/uL SLN  RBC 3.89 3.87-5.11 MIL/uL SLN  Hemoglobin 11.2 L 12.0-15.0 g/dL SLN  Hematocrit 33.9 L 36.0-46.0 % SLN  MCV 87.1 78.0-100.0 fL SLN  MCH 28.8 26.0-34.0 pg SLN  MCHC 33.0 30.0-36.0 g/dL SLN  RDW 13.9 11.5-15.5 % SLN  Platelet Count 250 150-400 K/uL SLN  Granulocyte % 64 43-77 % SLN  Absolute Gran 5.7 1.7-7.7 K/uL SLN  Lymph % 27 12-46 % SLN  Absolute Lymph 2.4 0.7-4.0 K/uL SLN  Mono % 6 3-12 % SLN  Absolute Mono 0.5 0.1-1.0 K/uL SLN  Eos % 3 0-5 % SLN  Absolute Eos 0.3  0.0-0.7 K/uL SLN  Baso % 0 0-1 % SLN  Absolute Baso 0.0 0.0-0.1 K/uL SLN  Smear Review Criteria for review not met SLN  Basic Metabolic Panel  Result: 10/24/5091 12:49 PM ( Status: F )  Sodium 139 135-145 mEq/L SLN  Potassium 3.8 3.5-5.3 mEq/L SLN  Chloride 102 96-112 mEq/L SLN  CO2 28 19-32 mEq/L SLN  Glucose 112 H 70-99 mg/dL SLN  BUN 25 H 6-23 mg/dL SLN  Creatinine 1.51 H 0.50-1.10  mg/dL SLN  Calcium 9.0 8.4-10.5 mg/dL SLN  Vitamin D (25-Hydroxy)  Result: 05/28/2013 2:21 AM ( Status: F )  Vitamin D (25-Hydroxy) 38 30-89 ng/mL SLN C  Comprehensive Metabolic Panel    Result: 07/29/2013 10:58 AM   ( Status: F )     C Sodium 146   H 135-145 mEq/L SLN   Potassium 3.5     3.5-5.3 mEq/L SLN   Chloride 106     96-112 mEq/L SLN   CO2 31     19-32 mEq/L SLN   Glucose 68   L 70-99 mg/dL SLN   BUN 24   H 6-23 mg/dL SLN   Creatinine 1.50   H 0.50-1.10 mg/dL SLN   Bilirubin, Total 0.5     0.2-1.2 mg/dL SLN   Alkaline Phosphatase 75     39-117 U/L SLN   AST/SGOT 12     0-37 U/L SLN   ALT/SGPT 15     0-35 U/L SLN   Total Protein 5.7   L 6.0-8.3 g/dL SLN   Albumin 3.4   L 3.5-5.2 g/dL SLN   Calcium 8.6     8.4-10.5 mg/dL SLN   Lipid Profile    Result: 07/29/2013 10:58 AM   ( Status: F )       Cholesterol 112     0-200 mg/dL SLN C Triglyceride 49     <150 mg/dL SLN   HDL Cholesterol 39   L >39 mg/dL SLN   Total Chol/HDL Ratio 2.9      Ratio SLN   VLDL Cholesterol (Calc) 10     0-40 mg/dL SLN   LDL Cholesterol (Calc) 63     0-99 mg/dL SLN C Hemoglobin A1C    Result: 07/29/2013 2:44 PM   ( Status: F )       Hemoglobin A1C 7.5   H <5.7 % SLN C Estimated Average Glucose 169   H <117 mg/dL SLN  Assessment/Plan 1. Type II or unspecified type diabetes mellitus with neurological manifestations, not stated as uncontrolled(250.60) -currently on lantus 25 units qhs, cbgs reviewed stable, no hypoglycemic episodes -A1c at goal for age, will cont current plan of care  2. CONGESTIVE HEART  FAILURE -overall stable no signs of worsening heart failure, continues Lasix $RemoveBeforeD'40mg'[id number]$  BID without potassium due to unable to swallow medication, potassium stable  3. HYPERTENSION -stable with cont current medications   4. GERD (gastroesophageal reflux disease) -without symptoms on current dose of nexium   5. CVA WITH LEFT HEMIPARESIS -conts on ASA  6. HYPERLIPIDEMIA -LDL at goal, will cont lipitor

## 2013-08-26 LAB — HEMOGLOBIN A1C: Hgb A1c MFr Bld: 7.2 % — AB (ref 4.0–6.0)

## 2013-09-14 ENCOUNTER — Non-Acute Institutional Stay (SKILLED_NURSING_FACILITY): Payer: Medicare Other | Admitting: Nurse Practitioner

## 2013-09-14 DIAGNOSIS — I509 Heart failure, unspecified: Secondary | ICD-10-CM

## 2013-09-14 DIAGNOSIS — K5900 Constipation, unspecified: Secondary | ICD-10-CM

## 2013-09-14 DIAGNOSIS — I10 Essential (primary) hypertension: Secondary | ICD-10-CM

## 2013-09-14 DIAGNOSIS — E559 Vitamin D deficiency, unspecified: Secondary | ICD-10-CM

## 2013-09-14 DIAGNOSIS — G609 Hereditary and idiopathic neuropathy, unspecified: Secondary | ICD-10-CM

## 2013-09-14 DIAGNOSIS — I69959 Hemiplegia and hemiparesis following unspecified cerebrovascular disease affecting unspecified side: Secondary | ICD-10-CM

## 2013-09-14 DIAGNOSIS — E1149 Type 2 diabetes mellitus with other diabetic neurological complication: Secondary | ICD-10-CM

## 2013-09-14 DIAGNOSIS — K219 Gastro-esophageal reflux disease without esophagitis: Secondary | ICD-10-CM

## 2013-09-14 NOTE — Progress Notes (Signed)
Patient ID: [name], female   DOB: [birth year], 68 y.o.   MRN: [id number]    Nursing Home Location:  [place] of Service: SNF (31)  PCP: No primary provider on file.  Allergies  Allergen Reactions  . Lotensin [Benazepril Hcl]     Chief Complaint  Patient presents with  . Medical Management of Chronic Issues    HPI:  68 year old female seen today for routine visit of chronic conditions, pt with a pmh of CHF, DM, GIB, HLD, and renal insufficiency, CVA. Patient has been in usual state of health in the last month with no significant changes in chronic conditions. nursing does not have concerns at this time and pt without complaints.    Review of Systems:  Review of Systems  Constitutional: Negative.  Negative for fever, chills and malaise/fatigue.  Respiratory: Negative for cough and shortness of breath.   Cardiovascular: Negative for chest pain and leg swelling.  Gastrointestinal: Negative for heartburn, abdominal pain and constipation.  Genitourinary: Negative for dysuria.  Musculoskeletal: Negative for back pain, joint pain and myalgias.  Skin: Negative.   Neurological: Negative for dizziness, weakness and headaches.  Psychiatric/Behavioral: Negative for depression. The patient does not have insomnia.      Past Medical History  Diagnosis Date  . HYPERTENSION 02/14/2006    Qualifier: Diagnosis of  By: Oretha Ellis    . PE 12/31/2008    Qualifier: Diagnosis of  By: Elie Confer PharmD, Ulice Dash    . MITRAL REGURGITATION 05/23/2006    Annotation: S/P valve repair Qualifier: Diagnosis of  By: Oretha Ellis    . CARDIOMYOPATHY 05/23/2006    Annotation: Likely 2/2 EtOH, DM and HTN Qualifier: Diagnosis of  By: Oretha Ellis    . CONGESTIVE HEART FAILURE 02/14/2006    Qualifier: Diagnosis of  By: Oretha Ellis    . DVT 12/31/2008    Qualifier: Diagnosis of  By: Caryn Section MD, Adwait    . Type II or unspecified type diabetes mellitus with  neurological manifestations, not stated as uncontrolled 02/23/2013  . PERIPHERAL NEUROPATHY 11/18/2006    Qualifier: Diagnosis of  By: Hilma Favors  DO, Beth    . DECUBITUS ULCER, BUTTOCK 02/14/2009    Qualifier: Diagnosis of  By: Cathren Laine MD, Ankit    . RENAL INSUFFICIENCY 05/23/2006    Qualifier: Diagnosis of  By: Oretha Ellis    . HYPOKALEMIA 11/26/2006    Qualifier: Diagnosis of  By: Hilma Favors  DO, Beth    . PACEMAKER, PERMANENT 05/23/2006    Qualifier: Diagnosis of  By: Oretha Ellis     Past Surgical History  Procedure Laterality Date  . Pacemaker insertion    . Cataract extraction Bilateral    Social History:   reports that she has quit smoking. She does not have any smokeless tobacco history on file. Her alcohol and drug histories are not on file.  No family history on file.  Medications: Patient's Medications  New Prescriptions   No medications on file  Previous Medications   AMLODIPINE (NORVASC) 5 MG TABLET    Take 5 mg by mouth daily.   ASPIRIN 81 MG TABLET    Take 81 mg by mouth daily.   ATORVASTATIN (LIPITOR) 20 MG TABLET    Take 60 mg by mouth daily.   BETHANECHOL (URECHOLINE) 10 MG TABLET    Take 20 mg by mouth 3 (three) times daily.   CARVEDILOL (COREG) 25 MG TABLET  Take 25 mg by mouth 2 (two) times daily with a meal.   ERGOCALCIFEROL (VITAMIN D2) 50000 UNITS CAPSULE    Take 50,000 Units by mouth every 30 (thirty) days.   ESOMEPRAZOLE (NEXIUM) 40 MG CAPSULE    Take 40 mg by mouth daily at 12 noon.   FUROSEMIDE (LASIX) 40 MG TABLET    Take 40 mg by mouth 2 (two) times daily.    INSULIN GLARGINE (LANTUS) 100 UNIT/ML INJECTION    Inject 25 Units into the skin at bedtime.   LOSARTAN (COZAAR) 50 MG TABLET    Take 50 mg by mouth daily.   METHYLPHENIDATE (RITALIN) 5 MG TABLET    Take one tablet by mouth every morning before breakfast   POLYETHYL GLYCOL-PROPYL GLYCOL (SYSTANE ULTRA OP)    Apply 1 drop to eye 3 (three) times daily. Both eyes  Modified Medications   No  medications on file  Discontinued Medications   No medications on file     Physical Exam:  Filed Vitals:   09/14/13 1605  BP: 135/63  Pulse: 77  Temp: 97.9 F (36.6 C)  Resp: 18   Physical Exam  Constitutional: She is well-developed, well-nourished, and in no distress.  HENT:  Head: Normocephalic and atraumatic.  Mouth/Throat: Oropharynx is clear and moist. No oropharyngeal exudate.  Neck: Normal range of motion. Neck supple.  Cardiovascular: Normal rate, regular rhythm and normal heart sounds.   Pulmonary/Chest: Effort normal and breath sounds normal.  Abdominal: Soft. Bowel sounds are normal.  Musculoskeletal: She exhibits no edema and no tenderness.  Self propels in [facility]  Neurological: She is alert.  Left sided hemiparesis   Skin: Skin is warm and dry.  Psychiatric: Affect normal.      Labs reviewed: 08-18-12: chol 127; ldl 72; trig 92  10-06-12: wbc 7.3; hgb 10.0; hct 30.5 mcv 87.1; plt 232; glucose 46; bun 24; creat 1.51; k+4.2; na++143; liver normal albumin 3.4; hgb a1c 6.4; vit d 31  10-17-12: chol 127; ldl 76; trig 67  12-22-12: glucose 92; bun 24; creat 1.60; k+4.3; na++142  03-11-13: glucose 103; bun 27; creat 1.62; k+3.9; na++ 144; liver normal albumin 3.7; hgb a1c 6.8  03-18-13: mirco-albumin: 1.07  CBC with Diff  Result: 05/27/2013 11:59 AM ( Status: F ) C  WBC 8.9 4.0-10.5 K/uL SLN  RBC 3.89 3.87-5.11 MIL/uL SLN  Hemoglobin 11.2 L 12.0-15.0 g/dL SLN  Hematocrit 33.9 L 36.0-46.0 % SLN  MCV 87.1 78.0-100.0 fL SLN  MCH 28.8 26.0-34.0 pg SLN  MCHC 33.0 30.0-36.0 g/dL SLN  RDW 13.9 11.5-15.5 % SLN  Platelet Count 250 150-400 K/uL SLN  Granulocyte % 64 43-77 % SLN  Absolute Gran 5.7 1.7-7.7 K/uL SLN  Lymph % 27 12-46 % SLN  Absolute Lymph 2.4 0.7-4.0 K/uL SLN  Mono % 6 3-12 % SLN  Absolute Mono 0.5 0.1-1.0 K/uL SLN  Eos % 3 0-5 % SLN  Absolute Eos 0.3 0.0-0.7 K/uL SLN  Baso % 0 0-1 % SLN  Absolute Baso 0.0 0.0-0.1 K/uL SLN  Smear Review Criteria for  review not met SLN  Basic Metabolic Panel  Result: 3/61/4431 12:49 PM ( Status: F )  Sodium 139 135-145 mEq/L SLN  Potassium 3.8 3.5-5.3 mEq/L SLN  Chloride 102 96-112 mEq/L SLN  CO2 28 19-32 mEq/L SLN  Glucose 112 H 70-99 mg/dL SLN  BUN 25 H 6-23 mg/dL SLN  Creatinine 1.51 H 0.50-1.10 mg/dL SLN  Calcium 9.0 8.4-10.5 mg/dL SLN  Vitamin D (25-Hydroxy)  Result: 05/28/2013  2:21 AM ( Status: F )  Vitamin D (25-Hydroxy) 38 30-89 ng/mL SLN C  Comprehensive Metabolic Panel  Result: 4/37/0052 10:58 AM ( Status: F ) C  Sodium 146 H 135-145 mEq/L SLN  Potassium 3.5 3.5-5.3 mEq/L SLN  Chloride 106 96-112 mEq/L SLN  CO2 31 19-32 mEq/L SLN  Glucose 68 L 70-99 mg/dL SLN  BUN 24 H 6-23 mg/dL SLN  Creatinine 1.50 H 0.50-1.10 mg/dL SLN  Bilirubin, Total 0.5 0.2-1.2 mg/dL SLN  Alkaline Phosphatase 75 39-117 U/L SLN  AST/SGOT 12 0-37 U/L SLN  ALT/SGPT 15 0-35 U/L SLN  Total Protein 5.7 L 6.0-8.3 g/dL SLN  Albumin 3.4 L 3.5-5.2 g/dL SLN  Calcium 8.6 8.4-10.5 mg/dL SLN  Lipid Profile  Result: 07/29/2013 10:58 AM ( Status: F )  Cholesterol 112 0-200 mg/dL SLN C  Triglyceride 49 <150 mg/dL SLN  HDL Cholesterol 39 L >39 mg/dL SLN  Total Chol/HDL Ratio 2.9 Ratio SLN  VLDL Cholesterol (Calc) 10 0-40 mg/dL SLN  LDL Cholesterol (Calc) 63 0-99 mg/dL SLN C  Hemoglobin A1C  Result: 07/29/2013 2:44 PM ( Status: F )  Hemoglobin A1C 7.5 H <5.7 % SLN C  Estimated Average Glucose 169 H <117 mg/dL SLN    Assessment/Plan 1. HYPERTENSION -Patient is stable; continue current regimen. Will monitor and make changes as necessary.  2. CONGESTIVE HEART FAILURE -without change, conts on coreg, lasix, cozaar and ASA  3. CONSTIPATION NOS -controlled at this time  4. GERD (gastroesophageal reflux disease) -conts on nexium  5. Type II or unspecified type diabetes mellitus with neurological manifestations, not stated as uncontrolled(250.60) - blood sugars reviewed and diabetes is controlled on current  medications, will cont to monitor  6. CVA WITH LEFT HEMIPARESIS without changes, conts on asa  7. PERIPHERAL NEUROPATHY -stable  8. Unspecified vitamin D deficiency -conts on vit D

## 2013-09-22 ENCOUNTER — Other Ambulatory Visit: Payer: Self-pay | Admitting: *Deleted

## 2013-09-22 MED ORDER — METHYLPHENIDATE HCL 5 MG PO TABS: ORAL_TABLET | ORAL | Status: DC

## 2013-09-22 NOTE — Telephone Encounter (Signed)
[facility] 

## 2013-10-19 ENCOUNTER — Non-Acute Institutional Stay (SKILLED_NURSING_FACILITY): Payer: Medicare Other | Admitting: Nurse Practitioner

## 2013-10-19 DIAGNOSIS — K5900 Constipation, unspecified: Secondary | ICD-10-CM

## 2013-10-19 DIAGNOSIS — K219 Gastro-esophageal reflux disease without esophagitis: Secondary | ICD-10-CM

## 2013-10-19 DIAGNOSIS — E1149 Type 2 diabetes mellitus with other diabetic neurological complication: Secondary | ICD-10-CM

## 2013-10-19 DIAGNOSIS — I10 Essential (primary) hypertension: Secondary | ICD-10-CM

## 2013-10-19 DIAGNOSIS — M1710 Unilateral primary osteoarthritis, unspecified knee: Secondary | ICD-10-CM | POA: Insufficient documentation

## 2013-10-19 DIAGNOSIS — IMO0002 Reserved for concepts with insufficient information to code with codable children: Secondary | ICD-10-CM

## 2013-10-19 NOTE — Progress Notes (Signed)
Patient ID: [name], female   DOB: [birth year], 68 y.o.   MRN: [id number]    Nursing Home Location:  [place] of Service: SNF (31)  PCP: No primary provider on file.  Allergies  Allergen Reactions  . Lotensin [Benazepril Hcl]     Chief Complaint  Patient presents with  . Medical Management of Chronic Issues    Routine Visit     HPI:  68 year old female seen today for routine visit of chronic conditions, pt with a pmh of CHF, DM, GIB, HLD, and renal insufficiency, CVA. [name] reports worsening knee pain in the afternoon. Takes tylenol for it which helps the pain. Reports this has been going on a while, wants medication scheduled. Reports throbing pain that goes into lower legs at times. No numbness or tingling. No injury. Otherwise pt without complaints.  Review of Systems:  Review of Systems  Constitutional: Negative.  Negative for fever, chills and malaise/fatigue.  Respiratory: Negative for cough and shortness of breath.   Cardiovascular: Negative for chest pain and leg swelling.  Gastrointestinal: Negative for heartburn, abdominal pain and constipation.  Genitourinary: Negative for dysuria.  Musculoskeletal: Positive for joint pain. Negative for back pain, falls and myalgias.  Skin: Negative.   Neurological: Negative for dizziness, tingling, weakness and headaches.  Psychiatric/Behavioral: Negative for depression. The patient does not have insomnia.      Past Medical History  Diagnosis Date  . HYPERTENSION 02/14/2006    Qualifier: Diagnosis of  By: Oretha Ellis    . PE 12/31/2008    Qualifier: Diagnosis of  By: Elie Confer PharmD, Ulice Dash    . MITRAL REGURGITATION 05/23/2006    Annotation: S/P valve repair Qualifier: Diagnosis of  By: Oretha Ellis    . CARDIOMYOPATHY 05/23/2006    Annotation: Likely 2/2 EtOH, DM and HTN Qualifier: Diagnosis of  By: Oretha Ellis    . CONGESTIVE HEART FAILURE 02/14/2006    Qualifier: Diagnosis  of  By: Oretha Ellis    . DVT 12/31/2008    Qualifier: Diagnosis of  By: Caryn Section MD, Adwait    . Type II or unspecified type diabetes mellitus with neurological manifestations, not stated as uncontrolled 02/23/2013  . PERIPHERAL NEUROPATHY 11/18/2006    Qualifier: Diagnosis of  By: Hilma Favors  DO, Beth    . DECUBITUS ULCER, BUTTOCK 02/14/2009    Qualifier: Diagnosis of  By: Cathren Laine MD, Ankit    . RENAL INSUFFICIENCY 05/23/2006    Qualifier: Diagnosis of  By: Oretha Ellis    . HYPOKALEMIA 11/26/2006    Qualifier: Diagnosis of  By: Hilma Favors  DO, Beth    . PACEMAKER, PERMANENT 05/23/2006    Qualifier: Diagnosis of  By: Oretha Ellis     Past Surgical History  Procedure Laterality Date  . Pacemaker insertion    . Cataract extraction Bilateral    Social History:   reports that she has quit smoking. She does not have any smokeless tobacco history on file. Her alcohol and drug histories are not on file.  No family history on file.  Medications: Patient's Medications  New Prescriptions   No medications on file  Previous Medications   ACETAMINOPHEN (TYLENOL) 325 MG TABLET    Take 650 mg by mouth every 4 (four) hours as needed (DO NOT EXCEED 3 GM OF APAP IN 24 HOURS).   AMLODIPINE (NORVASC) 5 MG TABLET    Take 5 mg by mouth daily.  ASPIRIN EC 81 MG TABLET    Take 81 mg by mouth daily.   ATORVASTATIN (LIPITOR) 20 MG TABLET    Take one daily  with 40 mg tablet for a total of 60 mg   ATORVASTATIN (LIPITOR) 40 MG TABLET    Take one daily with 20 mg tablet for a total of 60 mg   BETHANECHOL (URECHOLINE) 10 MG TABLET    Take 20 mg by mouth 3 (three) times daily.   CARVEDILOL (COREG) 25 MG TABLET    Take 25 mg by mouth 2 (two) times daily with a meal. For CAD   ERGOCALCIFEROL (VITAMIN D2) 50000 UNITS CAPSULE    Take 50,000 Units by mouth every 30 (thirty) days. On the 5th of every month   ESOMEPRAZOLE (NEXIUM) 40 MG CAPSULE    Take 40 mg by mouth daily at 12 noon. For GERD   FUROSEMIDE  (LASIX) 40 MG TABLET    Take 40 mg by mouth 2 (two) times daily. For CHF   INSULIN GLARGINE (LANTUS) 100 UNIT/ML INJECTION    Inject 25 Units into the skin at bedtime. DO NOT MIX WITH OTHER INSULINS   LOSARTAN (COZAAR) 50 MG TABLET    Take 50 mg by mouth daily. For Hypertension   METHYLPHENIDATE (RITALIN) 5 MG TABLET    Take one tablet by mouth every morning before breakfast   POLYETHYL GLYCOL-PROPYL GLYCOL (SYSTANE ULTRA OP)    Apply 1 drop to eye 3 (three) times daily. Both , wait 3-5 minutes between eye medications   PROMETHAZINE (PHENERGAN) 25 MG TABLET    Take 25 mg by mouth every 4 (four) hours as needed for nausea or vomiting (For nausea nad vomiting).  Modified Medications   No medications on file  Discontinued Medications   ASPIRIN 81 MG TABLET    Take 81 mg by mouth daily.     Physical Exam:  Filed Vitals:   10/19/13 1120  BP: 122/76  Pulse: 76  Temp: 97.1 F (36.2 C)  TempSrc: Oral  Resp: 16  Weight: 232 lb (105.235 kg)    Physical Exam  Constitutional: She is well-developed, well-nourished, and in no distress.  HENT:  Head: Normocephalic and atraumatic.  Mouth/Throat: Oropharynx is clear and moist. No oropharyngeal exudate.  Neck: Normal range of motion. Neck supple.  Cardiovascular: Normal rate, regular rhythm and normal heart sounds.   Pulmonary/Chest: Effort normal and breath sounds normal.  Abdominal: Soft. Bowel sounds are normal.  Musculoskeletal: She exhibits tenderness (to knees bilaterally). She exhibits no edema.  Self propels in WC  Neurological: She is alert.  Left sided hemiparesis   Skin: Skin is warm and dry.  Psychiatric: Affect normal.     Labs reviewed: 08-18-12: chol 127; ldl 72; trig 92  10-06-12: wbc 7.3; hgb 10.0; hct 30.5 mcv 87.1; plt 232; glucose 46; bun 24; creat 1.51; k+4.2; na++143; liver normal albumin 3.4; hgb a1c 6.4; vit d 31  10-17-12: chol 127; ldl 76; trig 67  12-22-12: glucose 92; bun 24; creat 1.60; k+4.3; na++142  03-11-13:  glucose 103; bun 27; creat 1.62; k+3.9; na++ 144; liver normal albumin 3.7; hgb a1c 6.8  03-18-13: mirco-albumin: 1.07  CBC with Diff  Result: 05/27/2013 11:59 AM ( Status: F ) C  WBC 8.9 4.0-10.5 K/uL SLN  RBC 3.89 3.87-5.11 MIL/uL SLN  Hemoglobin 11.2 L 12.0-15.0 g/dL SLN  Hematocrit 33.9 L 36.0-46.0 % SLN  MCV 87.1 78.0-100.0 fL SLN  MCH 28.8 26.0-34.0 pg SLN  MCHC 33.0 30.0-36.0   g/dL SLN  RDW 13.9 11.5-15.5 % SLN  Platelet Count 250 150-400 K/uL SLN  Granulocyte % 64 43-77 % SLN  Absolute Gran 5.7 1.7-7.7 K/uL SLN  Lymph % 27 12-46 % SLN  Absolute Lymph 2.4 0.7-4.0 K/uL SLN  Mono % 6 3-12 % SLN  Absolute Mono 0.5 0.1-1.0 K/uL SLN  Eos % 3 0-5 % SLN  Absolute Eos 0.3 0.0-0.7 K/uL SLN  Baso % 0 0-1 % SLN  Absolute Baso 0.0 0.0-0.1 K/uL SLN  Smear Review Criteria for review not met SLN  Basic Metabolic Panel  Result: 05/27/2013 12:49 PM ( Status: F )  Sodium 139 135-145 mEq/L SLN  Potassium 3.8 3.5-5.3 mEq/L SLN  Chloride 102 96-112 mEq/L SLN  CO2 28 19-32 mEq/L SLN  Glucose 112 H 70-99 mg/dL SLN  BUN 25 H 6-23 mg/dL SLN  Creatinine 1.51 H 0.50-1.10 mg/dL SLN  Calcium 9.0 8.4-10.5 mg/dL SLN  Vitamin D (25-Hydroxy)  Result: 05/28/2013 2:21 AM ( Status: F )  Vitamin D (25-Hydroxy) 38 30-89 ng/mL SLN C  Comprehensive Metabolic Panel  Result: 07/29/2013 10:58 AM ( Status: F ) C  Sodium 146 H 135-145 mEq/L SLN  Potassium 3.5 3.5-5.3 mEq/L SLN  Chloride 106 96-112 mEq/L SLN  CO2 31 19-32 mEq/L SLN  Glucose 68 L 70-99 mg/dL SLN  BUN 24 H 6-23 mg/dL SLN  Creatinine 1.50 H 0.50-1.10 mg/dL SLN  Bilirubin, Total 0.5 0.2-1.2 mg/dL SLN  Alkaline Phosphatase 75 39-117 U/L SLN  AST/SGOT 12 0-37 U/L SLN  ALT/SGPT 15 0-35 U/L SLN  Total Protein 5.7 L 6.0-8.3 g/dL SLN  Albumin 3.4 L 3.5-5.2 g/dL SLN  Calcium 8.6 8.4-10.5 mg/dL SLN  Lipid Profile  Result: 07/29/2013 10:58 AM ( Status: F )  Cholesterol 112 0-200 mg/dL SLN C  Triglyceride 49 <150 mg/dL SLN  HDL Cholesterol 39 L >39  mg/dL SLN  Total Chol/HDL Ratio 2.9 Ratio SLN  VLDL Cholesterol (Calc) 10 0-40 mg/dL SLN  LDL Cholesterol (Calc) 63 0-99 mg/dL SLN C  Hemoglobin A1C  Result: 07/29/2013 2:44 PM ( Status: F )  Hemoglobin A1C 7.5 H <5.7 % SLN C  Estimated Average Glucose 169 H <117 mg/dL SLN   Assessment/Plan  1. HYPERTENSION -conts on cozaar, Norvasc, ASA  2. GERD (gastroesophageal reflux disease) -without compliants on nexium  3. CONSTIPATION NOS -stable on current regimen   4. Type II or unspecified type diabetes mellitus with neurological manifestations, not stated as uncontrolled(250.60) -A1c of 7.2 in April, will cont to monitor and cont current lantus at this time.  5. Osteoarthrosis, unspecified whether generalized or localized, involving lower leg -reports worsening pain in her knees and lower legs after self propelling in WC -pain medication has helped but would like this regularly, will schedule tylenol 650 mg q day at 2 pm and to cont PRN schedule.   

## 2013-10-22 ENCOUNTER — Other Ambulatory Visit: Payer: Self-pay | Admitting: *Deleted

## 2013-10-22 MED ORDER — METHYLPHENIDATE HCL 5 MG PO TABS: ORAL_TABLET | ORAL | Status: DC

## 2013-10-22 NOTE — Telephone Encounter (Signed)
[facility] 

## 2013-11-16 ENCOUNTER — Non-Acute Institutional Stay (SKILLED_NURSING_FACILITY): Payer: Medicare Other | Admitting: Nurse Practitioner

## 2013-11-16 DIAGNOSIS — IMO0002 Reserved for concepts with insufficient information to code with codable children: Secondary | ICD-10-CM

## 2013-11-16 DIAGNOSIS — M1710 Unilateral primary osteoarthritis, unspecified knee: Secondary | ICD-10-CM

## 2013-11-16 DIAGNOSIS — I10 Essential (primary) hypertension: Secondary | ICD-10-CM

## 2013-11-16 DIAGNOSIS — I69959 Hemiplegia and hemiparesis following unspecified cerebrovascular disease affecting unspecified side: Secondary | ICD-10-CM

## 2013-11-16 DIAGNOSIS — K219 Gastro-esophageal reflux disease without esophagitis: Secondary | ICD-10-CM

## 2013-11-16 DIAGNOSIS — K5900 Constipation, unspecified: Secondary | ICD-10-CM

## 2013-11-16 DIAGNOSIS — E1149 Type 2 diabetes mellitus with other diabetic neurological complication: Secondary | ICD-10-CM

## 2013-11-18 ENCOUNTER — Encounter: Payer: Self-pay | Admitting: Internal Medicine

## 2013-11-22 NOTE — Progress Notes (Signed)
Patient ID: [name], female   DOB: 1945-08-23, 68 y.o.   MRN: [id number]    Nursing Home Location:  [place] of Service: SNF (31)  PCP: No primary provider on file.  Allergies  Allergen Reactions  . Lotensin [Benazepril Hcl]     Chief Complaint  Patient presents with  . Medical Management of Chronic Issues    HPI:  68 year old female pmh of CHF, DM, GIB, HLD, and renal insufficiency, CVA who is seen today for routine visit of chronic conditions. Pt reports improvement with afternoon pain with scheduled tylenol. In the last month pt has been stable. No changes in functional or cognitive status.   Review of Systems:  Review of Systems  Constitutional: Negative.  Negative for fever, chills and malaise/fatigue.  Eyes:       On scheduled to see in facility eye doctor   Respiratory: Negative for cough and shortness of breath.   Cardiovascular: Negative for chest pain and leg swelling.  Gastrointestinal: Negative for heartburn, abdominal pain and constipation.  Genitourinary: Negative for dysuria.  Musculoskeletal: Positive for joint pain (has improved). Negative for back pain, falls and myalgias.  Skin: Negative.   Neurological: Negative for dizziness, tingling, weakness and headaches.  Psychiatric/Behavioral: Negative for depression. The patient does not have insomnia.      Past Medical History  Diagnosis Date  . HYPERTENSION 02/14/2006    Qualifier: Diagnosis of  By: Oretha Ellis    . PE 12/31/2008    Qualifier: Diagnosis of  By: Elie Confer PharmD, Ulice Dash    . MITRAL REGURGITATION 05/23/2006    Annotation: S/P valve repair Qualifier: Diagnosis of  By: Oretha Ellis    . CARDIOMYOPATHY 05/23/2006    Annotation: Likely 2/2 EtOH, DM and HTN Qualifier: Diagnosis of  By: Oretha Ellis    . CONGESTIVE HEART FAILURE 02/14/2006    Qualifier: Diagnosis of  By: Oretha Ellis    . DVT 12/31/2008    Qualifier: Diagnosis of  By: Caryn Section  MD, Adwait    . Type II or unspecified type diabetes mellitus with neurological manifestations, not stated as uncontrolled 02/23/2013  . PERIPHERAL NEUROPATHY 11/18/2006    Qualifier: Diagnosis of  By: Hilma Favors  DO, Beth    . DECUBITUS ULCER, BUTTOCK 02/14/2009    Qualifier: Diagnosis of  By: Cathren Laine MD, Ankit    . RENAL INSUFFICIENCY 05/23/2006    Qualifier: Diagnosis of  By: Oretha Ellis    . HYPOKALEMIA 11/26/2006    Qualifier: Diagnosis of  By: Hilma Favors  DO, Beth    . PACEMAKER, PERMANENT 05/23/2006    Qualifier: Diagnosis of  By: Oretha Ellis     Past Surgical History  Procedure Laterality Date  . Pacemaker insertion    . Cataract extraction Bilateral    Social History:   reports that she has quit smoking. She does not have any smokeless tobacco history on file. Her alcohol and drug histories are not on file.  No family history on file.  Medications: Patient's Medications  New Prescriptions   No medications on file  Previous Medications   ACETAMINOPHEN (TYLENOL) 325 MG TABLET    Take 650 mg by mouth every 4 (four) hours as needed (DO NOT EXCEED 3 GM OF APAP IN 24 HOURS).   AMLODIPINE (NORVASC) 5 MG TABLET    Take 5 mg by mouth daily. For HTN   ASPIRIN EC 81 MG TABLET    Take  81 mg by mouth daily.   ATORVASTATIN (LIPITOR) 20 MG TABLET    Take one daily  with 40 mg tablet for a total of 60 mg   ATORVASTATIN (LIPITOR) 40 MG TABLET    Take one daily with 20 mg tablet for a total of 60 mg   BETHANECHOL (URECHOLINE) 10 MG TABLET    Take 20 mg by mouth 3 (three) times daily.   CARVEDILOL (COREG) 25 MG TABLET    Take 25 mg by mouth 2 (two) times daily with a meal. For CAD   ERGOCALCIFEROL (VITAMIN D2) 50000 UNITS CAPSULE    Take 50,000 Units by mouth every 30 (thirty) days. On the 5th of every month   ESOMEPRAZOLE (NEXIUM) 40 MG CAPSULE    Take 40 mg by mouth daily at 12 noon. For GERD   FUROSEMIDE (LASIX) 40 MG TABLET    Take 40 mg by mouth 2 (two) times daily. For CHF    INSULIN GLARGINE (LANTUS) 100 UNIT/ML INJECTION    Inject 25 Units into the skin at bedtime. DO NOT MIX WITH OTHER INSULINS   LOSARTAN (COZAAR) 50 MG TABLET    Take 50 mg by mouth daily. For Hypertension   METHYLPHENIDATE (RITALIN) 5 MG TABLET    Take one tablet by mouth every morning before breakfast   NUTRITIONAL SUPPLEMENTS (BOOST PO)    Take 237 mLs by mouth every evening.   POLYETHYL GLYCOL-PROPYL GLYCOL (SYSTANE ULTRA OP)    Apply 1 drop to eye 3 (three) times daily. Both , wait 3-5 minutes between eye medications   PROMETHAZINE (PHENERGAN) 25 MG TABLET    Take 25 mg by mouth every 4 (four) hours as needed for nausea or vomiting (For nausea nad vomiting).  Modified Medications   No medications on file  Discontinued Medications   No medications on file     Physical Exam: Physical Exam  Constitutional: She is well-developed, well-nourished, and in no distress.  HENT:  Head: Normocephalic and atraumatic.  Mouth/Throat: Oropharynx is clear and moist. No oropharyngeal exudate.  Neck: Normal range of motion. Neck supple.  Cardiovascular: Normal rate, regular rhythm and normal heart sounds.   Pulmonary/Chest: Effort normal and breath sounds normal.  Abdominal: Soft. Bowel sounds are normal.  Musculoskeletal: She exhibits no edema. Tenderness: to knees bilaterally.  Self propels in [facility]  Neurological: She is alert.  Left sided hemiparesis   Skin: Skin is warm and dry.  Psychiatric: Affect normal.     Filed Vitals:   11/16/13 1149  BP: 123/66  Pulse: 69  Temp: 97.8 F (36.6 C)  Resp: 20  Weight: 232 lb (105.235 kg)      Labs reviewed:   Assessment/Plan 1. CONSTIPATION NOS Without complaints  2. Gastroesophageal reflux disease without esophagitis -stable on nexium  3. HYPERTENSION Well controlled on current medications -will follow up bmp  4. Osteoarthrosis, unspecified whether generalized or localized, involving lower leg -improved with scheduled tylenol  5.  Type II or unspecified type diabetes mellitus with neurological manifestations, not stated as uncontrolled(250.60) -cbgs reviewed, fasting 80-150, no hypoglycemic episodes, will follow up A1c  6. CVA WITH LEFT HEMIPARESIS -conts on asa, will follow up cbc -left hemiparesis remains stable conts on lipitor  Labs/tests ordered colonoscopy for health maintenance- if pt and family would like this to be done -cbc, cmp, a1c

## 2013-11-23 ENCOUNTER — Other Ambulatory Visit: Payer: Self-pay | Admitting: *Deleted

## 2013-11-23 MED ORDER — METHYLPHENIDATE HCL 5 MG PO TABS: ORAL_TABLET | ORAL | Status: DC

## 2013-11-23 NOTE — Telephone Encounter (Signed)
[facility] 

## 2013-12-21 ENCOUNTER — Non-Acute Institutional Stay (SKILLED_NURSING_FACILITY): Payer: Medicare Other | Admitting: Nurse Practitioner

## 2013-12-21 DIAGNOSIS — H1013 Acute atopic conjunctivitis, bilateral: Secondary | ICD-10-CM

## 2013-12-21 DIAGNOSIS — IMO0002 Reserved for concepts with insufficient information to code with codable children: Secondary | ICD-10-CM

## 2013-12-21 DIAGNOSIS — E1149 Type 2 diabetes mellitus with other diabetic neurological complication: Secondary | ICD-10-CM

## 2013-12-21 DIAGNOSIS — I69959 Hemiplegia and hemiparesis following unspecified cerebrovascular disease affecting unspecified side: Secondary | ICD-10-CM

## 2013-12-21 DIAGNOSIS — M1710 Unilateral primary osteoarthritis, unspecified knee: Secondary | ICD-10-CM

## 2013-12-21 DIAGNOSIS — H1010 Acute atopic conjunctivitis, unspecified eye: Secondary | ICD-10-CM

## 2013-12-21 DIAGNOSIS — I509 Heart failure, unspecified: Secondary | ICD-10-CM

## 2013-12-21 DIAGNOSIS — K5900 Constipation, unspecified: Secondary | ICD-10-CM

## 2013-12-21 NOTE — Progress Notes (Signed)
Patient ID: [name], female   DOB: 1945-08-23, 68 y.o.   MRN: [id number]    Nursing Home Location:  [place] of Service: SNF (31)  PCP: No primary provider on file.  Allergies  Allergen Reactions  . Lotensin [Benazepril Hcl]     Chief Complaint  Patient presents with  . Medical Management of Chronic Issues    HPI:  68 year old female pmh of CHF, DM, GIB, HLD, and renal insufficiency, CVA who is seen today for routine visit of chronic conditions. Pt reports improvement with afternoon pain with scheduled tylenol. In the last month pt has been stable. No changes in functional or cognitive status.   Review of Systems:  Review of Systems  Constitutional: Negative.  Negative for fever, chills and malaise/fatigue.  Eyes:       On scheduled to see in facility eye doctor   Respiratory: Negative for cough and shortness of breath.   Cardiovascular: Negative for chest pain and leg swelling.  Gastrointestinal: Negative for heartburn, abdominal pain and constipation.  Genitourinary: Negative for dysuria.  Musculoskeletal: Positive for joint pain (has improved). Negative for back pain, falls and myalgias.  Skin: Negative.   Neurological: Negative for dizziness, tingling, weakness and headaches.  Psychiatric/Behavioral: Negative for depression. The patient does not have insomnia.      Past Medical History  Diagnosis Date  . HYPERTENSION 02/14/2006    Qualifier: Diagnosis of  By: Oretha Ellis    . PE 12/31/2008    Qualifier: Diagnosis of  By: Elie Confer PharmD, Ulice Dash    . MITRAL REGURGITATION 05/23/2006    Annotation: S/P valve repair Qualifier: Diagnosis of  By: Oretha Ellis    . CARDIOMYOPATHY 05/23/2006    Annotation: Likely 2/2 EtOH, DM and HTN Qualifier: Diagnosis of  By: Oretha Ellis    . CONGESTIVE HEART FAILURE 02/14/2006    Qualifier: Diagnosis of  By: Oretha Ellis    . DVT 12/31/2008    Qualifier: Diagnosis of  By: Caryn Section  MD, Adwait    . Type II or unspecified type diabetes mellitus with neurological manifestations, not stated as uncontrolled 02/23/2013  . PERIPHERAL NEUROPATHY 11/18/2006    Qualifier: Diagnosis of  By: Hilma Favors  DO, Beth    . DECUBITUS ULCER, BUTTOCK 02/14/2009    Qualifier: Diagnosis of  By: Cathren Laine MD, Ankit    . RENAL INSUFFICIENCY 05/23/2006    Qualifier: Diagnosis of  By: Oretha Ellis    . HYPOKALEMIA 11/26/2006    Qualifier: Diagnosis of  By: Hilma Favors  DO, Beth    . PACEMAKER, PERMANENT 05/23/2006    Qualifier: Diagnosis of  By: Oretha Ellis     Past Surgical History  Procedure Laterality Date  . Pacemaker insertion    . Cataract extraction Bilateral    Social History:   reports that she has quit smoking. She does not have any smokeless tobacco history on file. Her alcohol and drug histories are not on file.  No family history on file.  Medications: Patient's Medications  New Prescriptions   No medications on file  Previous Medications   ACETAMINOPHEN (TYLENOL) 325 MG TABLET    Take 650 mg by mouth every 4 (four) hours as needed (DO NOT EXCEED 3 GM OF APAP IN 24 HOURS).   AMLODIPINE (NORVASC) 5 MG TABLET    Take 5 mg by mouth daily. For HTN   ASPIRIN EC 81 MG TABLET    Take  81 mg by mouth daily.   ATORVASTATIN (LIPITOR) 20 MG TABLET    Take one daily  with 40 mg tablet for a total of 60 mg   ATORVASTATIN (LIPITOR) 40 MG TABLET    Take one daily with 20 mg tablet for a total of 60 mg   BETHANECHOL (URECHOLINE) 10 MG TABLET    Take 20 mg by mouth 3 (three) times daily.   CARVEDILOL (COREG) 25 MG TABLET    Take 25 mg by mouth 2 (two) times daily with a meal. For CAD   ERGOCALCIFEROL (VITAMIN D2) 50000 UNITS CAPSULE    Take 50,000 Units by mouth every 30 (thirty) days. On the 5th of every month   ESOMEPRAZOLE (NEXIUM) 40 MG CAPSULE    Take 40 mg by mouth daily at 12 noon. For GERD   FUROSEMIDE (LASIX) 40 MG TABLET    Take 40 mg by mouth 2 (two) times daily. For CHF    INSULIN GLARGINE (LANTUS) 100 UNIT/ML INJECTION    Inject 25 Units into the skin at bedtime. DO NOT MIX WITH OTHER INSULINS   LOSARTAN (COZAAR) 50 MG TABLET    Take 50 mg by mouth daily. For Hypertension   METHYLPHENIDATE (RITALIN) 5 MG TABLET    Take one tablet by mouth every morning before breakfast   NUTRITIONAL SUPPLEMENTS (BOOST PO)    Take 237 mLs by mouth every evening.   OLOPATADINE HCL (PATADAY) 0.2 % SOLN    Apply to eye.   POLYETHYL GLYCOL-PROPYL GLYCOL (SYSTANE ULTRA OP)    Apply 1 drop to eye 3 (three) times daily. Both , wait 3-5 minutes between eye medications   PROMETHAZINE (PHENERGAN) 25 MG TABLET    Take 25 mg by mouth every 4 (four) hours as needed for nausea or vomiting (For nausea nad vomiting).  Modified Medications   No medications on file  Discontinued Medications   No medications on file     Physical Exam: Physical Exam  Constitutional: She is well-developed, well-nourished, and in no distress.  HENT:  Head: Normocephalic and atraumatic.  Mouth/Throat: Oropharynx is clear and moist. No oropharyngeal exudate.  Neck: Normal range of motion. Neck supple.  Cardiovascular: Normal rate, regular rhythm and normal heart sounds.   Pulmonary/Chest: Effort normal and breath sounds normal.  Abdominal: Soft. Bowel sounds are normal.  Musculoskeletal: She exhibits no edema. Tenderness: to knees bilaterally.  Self propels in [facility]  Neurological: She is alert.  Left sided hemiparesis   Skin: Skin is warm and dry.  Psychiatric: Affect normal.     Filed Vitals:   12/21/13 1644  BP: 134/74  Pulse: 76  Resp: 20      Labs reviewed: 08-18-12: chol 127; ldl 72; trig 92  10-06-12: wbc 7.3; hgb 10.0; hct 30.5 mcv 87.1; plt 232; glucose 46; bun 24; creat 1.51; k+4.2; na++143; liver normal albumin 3.4; hgb a1c 6.4; vit d 31  10-17-12: chol 127; ldl 76; trig 67  12-22-12: glucose 92; bun 24; creat 1.60; k+4.3; na++142  03-11-13: glucose 103; bun 27; creat 1.62; k+3.9; na++ 144;  liver normal albumin 3.7; hgb a1c 6.8  03-18-13: mirco-albumin: 1.07  CBC with Diff  Result: 05/27/2013 11:59 AM ( Status: F ) C  WBC 8.9 4.0-10.5 K/uL SLN  RBC 3.89 3.87-5.11 MIL/uL SLN  Hemoglobin 11.2 L 12.0-15.0 g/dL SLN  Hematocrit 33.9 L 36.0-46.0 % SLN  MCV 87.1 78.0-100.0 fL SLN  MCH 28.8 26.0-34.0 pg SLN  MCHC 33.0 30.0-36.0 g/dL SLN  RDW  13.9 11.5-15.5 % SLN  Platelet Count 250 150-400 K/uL SLN  Granulocyte % 64 43-77 % SLN  Absolute Gran 5.7 1.7-7.7 K/uL SLN  Lymph % 27 12-46 % SLN  Absolute Lymph 2.4 0.7-4.0 K/uL SLN  Mono % 6 3-12 % SLN  Absolute Mono 0.5 0.1-1.0 K/uL SLN  Eos % 3 0-5 % SLN  Absolute Eos 0.3 0.0-0.7 K/uL SLN  Baso % 0 0-1 % SLN  Absolute Baso 0.0 0.0-0.1 K/uL SLN  Smear Review Criteria for review not met SLN  Basic Metabolic Panel  Result: 01/03/5620 12:49 PM ( Status: F )  Sodium 139 135-145 mEq/L SLN  Potassium 3.8 3.5-5.3 mEq/L SLN  Chloride 102 96-112 mEq/L SLN  CO2 28 19-32 mEq/L SLN  Glucose 112 H 70-99 mg/dL SLN  BUN 25 H 6-23 mg/dL SLN  Creatinine 1.51 H 0.50-1.10 mg/dL SLN  Calcium 9.0 8.4-10.5 mg/dL SLN  Vitamin D (25-Hydroxy)  Result: 05/28/2013 2:21 AM ( Status: F )  Vitamin D (25-Hydroxy) 38 30-89 ng/mL SLN C  Comprehensive Metabolic Panel  Result: 07/12/6576 10:58 AM ( Status: F ) C  Sodium 146 H 135-145 mEq/L SLN  Potassium 3.5 3.5-5.3 mEq/L SLN  Chloride 106 96-112 mEq/L SLN  CO2 31 19-32 mEq/L SLN  Glucose 68 L 70-99 mg/dL SLN  BUN 24 H 6-23 mg/dL SLN  Creatinine 1.50 H 0.50-1.10 mg/dL SLN  Bilirubin, Total 0.5 0.2-1.2 mg/dL SLN  Alkaline Phosphatase 75 39-117 U/L SLN  AST/SGOT 12 0-37 U/L SLN  ALT/SGPT 15 0-35 U/L SLN  Total Protein 5.7 L 6.0-8.3 g/dL SLN  Albumin 3.4 L 3.5-5.2 g/dL SLN  Calcium 8.6 8.4-10.5 mg/dL SLN  Lipid Profile  Result: 07/29/2013 10:58 AM ( Status: F )  Cholesterol 112 0-200 mg/dL SLN C  Triglyceride 49 <150 mg/dL SLN  HDL Cholesterol 39 L >39 mg/dL SLN  Total Chol/HDL Ratio 2.9 Ratio SLN    VLDL Cholesterol (Calc) 10 0-40 mg/dL SLN  LDL Cholesterol (Calc) 63 0-99 mg/dL SLN C  Hemoglobin A1C  Result: 07/29/2013 2:44 PM ( Status: F )  Hemoglobin A1C 7.5 H <5.7 % SLN C  Estimated Average Glucose 169 H <117 mg/dL SLN  CBC NO Diff (Complete Blood Count)    Result: 11/18/2013 9:02 AM   ( Status: F )     C WBC 10.0     4.0-10.5 K/uL SLN   RBC 3.66   L 3.87-5.11 MIL/uL SLN   Hemoglobin 10.3   L 12.0-15.0 g/dL SLN   Hematocrit 31.3   L 36.0-46.0 % SLN   MCV 85.5     78.0-100.0 fL SLN   MCH 28.1     26.0-34.0 pg SLN   MCHC 32.9     30.0-36.0 g/dL SLN   RDW 14.2     11.5-15.5 % SLN   Platelet Count 252     150-400 K/uL SLN   Comprehensive Metabolic Panel    Result: 11/18/2013 11:28 AM   ( Status: F )       Sodium 143     135-145 mEq/L SLN   Potassium 3.6     3.5-5.3 mEq/L SLN   Chloride 105     96-112 mEq/L SLN   CO2 27     19-32 mEq/L SLN   Glucose 134   H 70-99 mg/dL SLN   BUN 27   H 6-23 mg/dL SLN   Creatinine 1.46   H 0.50-1.10 mg/dL SLN   Bilirubin, Total 0.4     0.2-1.2 mg/dL SLN  Alkaline Phosphatase 86     39-117 U/L SLN   AST/SGOT 13     0-37 U/L SLN   ALT/SGPT 17     0-35 U/L SLN   Total Protein 5.7   L 6.0-8.3 g/dL SLN   Albumin 3.3   L 3.5-5.2 g/dL SLN   Calcium 8.6     8.4-10.5 mg/dL SLN   Hemoglobin A1C    Result: 11/18/2013 11:25 AM   ( Status: F )       Hemoglobin A1C 7.6   H <5.7 % SLN C Estimated Average Glucose 171   H <117 mg/dL SLN   Assessment/Plan  1. Acute atopic conjunctivitis, bilateral -seen by facility eye Md, placed on pataday for conjunctivitis, will cont at this time  2. Type II or unspecified type diabetes mellitus with neurological manifestations, not stated as uncontrolled(250.60) -cbg reviewed, fasting range from 100- 170s, worsen CBGs after meals, A1c slightly worse -will add tradjenta 5 mg daily   3. CVA WITH LEFT HEMIPARESIS Without change, conts on asa,  Cbc stable   4. Osteoarthrosis, unspecified whether generalized or  localized, involving lower leg Stable   5. CONSTIPATION NOS stable  6. CONGESTIVE HEART FAILURE -without signs of fluid overload, conts lasix

## 2013-12-28 ENCOUNTER — Other Ambulatory Visit: Payer: Self-pay | Admitting: *Deleted

## 2013-12-28 MED ORDER — METHYLPHENIDATE HCL 5 MG PO TABS: ORAL_TABLET | ORAL | Status: DC

## 2013-12-28 NOTE — Telephone Encounter (Signed)
[facility] 

## 2014-01-04 ENCOUNTER — Encounter: Payer: Self-pay | Admitting: Internal Medicine

## 2014-01-08 ENCOUNTER — Encounter: Payer: Self-pay | Admitting: *Deleted

## 2014-01-08 NOTE — Progress Notes (Signed)
Patient ID: [name], female   DOB: 21-Mar-1946, 68 y.o.   MRN: [id number] Pt scheduled for recall colon at [facility] 01/27/14.  Last colon 1/14/ 2010 with Dr Henrene Pastor.  Pt had CVA in July 2010 with hemiparesis. I talked with  Nurse Merita Norton at [facility] where pt resides.  Pt is wheelchair bound and does not walk..  Due to pt's change of health status since last colonoscopy and non weight bearing; new pt appt was scheduled with Dr Henrene Pastor on 11/6 at 8:45.  PV and colonoscopy for 9/23 cancelled.  Pt's daughter is [name] and she is POA.  Best numbers to reach her is (work) [phone] and (home) [phone].  I talked with her and she will come with pt for OV on 11/6.  Nurse Merita Norton is aware of cancelling PV 9/9 and colonoscopy 9/23 and scheduling new pt appt with Dr. Henrene Pastor on 11/6.

## 2014-01-25 ENCOUNTER — Other Ambulatory Visit: Payer: Self-pay | Admitting: *Deleted

## 2014-01-25 ENCOUNTER — Non-Acute Institutional Stay (SKILLED_NURSING_FACILITY): Payer: Medicare Other | Admitting: Nurse Practitioner

## 2014-01-25 DIAGNOSIS — E559 Vitamin D deficiency, unspecified: Secondary | ICD-10-CM

## 2014-01-25 DIAGNOSIS — E1149 Type 2 diabetes mellitus with other diabetic neurological complication: Secondary | ICD-10-CM

## 2014-01-25 DIAGNOSIS — IMO0002 Reserved for concepts with insufficient information to code with codable children: Secondary | ICD-10-CM

## 2014-01-25 DIAGNOSIS — I10 Essential (primary) hypertension: Secondary | ICD-10-CM

## 2014-01-25 DIAGNOSIS — I69959 Hemiplegia and hemiparesis following unspecified cerebrovascular disease affecting unspecified side: Secondary | ICD-10-CM

## 2014-01-25 DIAGNOSIS — K219 Gastro-esophageal reflux disease without esophagitis: Secondary | ICD-10-CM

## 2014-01-25 DIAGNOSIS — M1710 Unilateral primary osteoarthritis, unspecified knee: Secondary | ICD-10-CM

## 2014-01-25 MED ORDER — ERGOCALCIFEROL 1.25 MG (50000 UT) PO CAPS: 50000.0000 [IU] | ORAL_CAPSULE | ORAL | Status: DC

## 2014-01-25 MED ORDER — METHYLPHENIDATE HCL 5 MG PO TABS: ORAL_TABLET | ORAL | Status: DC

## 2014-01-25 NOTE — Telephone Encounter (Signed)
[facility] 

## 2014-01-25 NOTE — Progress Notes (Signed)
Patient ID: [name], female   DOB: [birth year], 68 y.o.   MRN: [id number]    Nursing Home Location:  [facility]   Place of Service: SNF (31)  PCP: No primary provider on file.  Allergies  Allergen Reactions  . Lotensin [Benazepril Hcl]     Chief Complaint  Patient presents with  . Medical Management of Chronic Issues    HPI:  68 year old female pmh of CHF, DM, GIB, HLD, and renal insufficiency, CVA who is seen today for routine visit of chronic conditions. There has been no changes in the last month. Pt without acute illness or worsening of chronic conditions.  Blood sugars reviewed and in appropriate range at this time  Review of Systems:  Review of Systems  Constitutional: Negative.  Negative for fever, chills and malaise/fatigue.  Respiratory: Negative for cough and shortness of breath.   Cardiovascular: Negative for chest pain and leg swelling.  Gastrointestinal: Negative for heartburn, abdominal pain and constipation.  Genitourinary: Negative for dysuria.  Musculoskeletal: Negative for back pain, falls, joint pain and myalgias.  Skin: Negative.   Neurological: Negative for dizziness, tingling, weakness and headaches.  Psychiatric/Behavioral: Negative for depression. The patient does not have insomnia.      Past Medical History  Diagnosis Date  . HYPERTENSION 02/14/2006    Qualifier: Diagnosis of  By: Danae Chen    . PE 12/31/2008    Qualifier: Diagnosis of  By: Alexandria Lodge PharmD, Vonna Kotyk    . MITRAL REGURGITATION 05/23/2006    Annotation: S/P valve repair Qualifier: Diagnosis of  By: Danae Chen    . CARDIOMYOPATHY 05/23/2006    Annotation: Likely 2/2 EtOH, DM and HTN Qualifier: Diagnosis of  By: Danae Chen    . CONGESTIVE HEART FAILURE 02/14/2006    Qualifier: Diagnosis of  By: Danae Chen    . DVT 12/31/2008    Qualifier: Diagnosis of  By: Polly Cobia MD, Adwait    . Type II or unspecified type diabetes mellitus with  neurological manifestations, not stated as uncontrolled 02/23/2013  . PERIPHERAL NEUROPATHY 11/18/2006    Qualifier: Diagnosis of  By: Phillips Odor  DO, Beth    . DECUBITUS ULCER, BUTTOCK 02/14/2009    Qualifier: Diagnosis of  By: Eben Burow MD, Ankit    . RENAL INSUFFICIENCY 05/23/2006    Qualifier: Diagnosis of  By: Danae Chen    . HYPOKALEMIA 11/26/2006    Qualifier: Diagnosis of  By: Phillips Odor  DO, Beth    . PACEMAKER, PERMANENT 05/23/2006    Qualifier: Diagnosis of  By: Danae Chen     Past Surgical History  Procedure Laterality Date  . Pacemaker insertion    . Cataract extraction Bilateral    Social History:   reports that she has quit smoking. She does not have any smokeless tobacco history on file. Her alcohol and drug histories are not on file.  No family history on file.  Medications: Patient's Medications  New Prescriptions   No medications on file  Previous Medications   ACETAMINOPHEN (TYLENOL) 325 MG TABLET    Take 650 mg by mouth every 4 (four) hours as needed (DO NOT EXCEED 3 GM OF APAP IN 24 HOURS).   AMLODIPINE (NORVASC) 5 MG TABLET    Take 5 mg by mouth daily. For HTN   ASPIRIN EC 81 MG TABLET    Take 81 mg by mouth daily.   ATORVASTATIN (LIPITOR) 20 MG TABLET    Take one  daily  with 40 mg tablet for a total of 60 mg   ATORVASTATIN (LIPITOR) 40 MG TABLET    Take one daily with 20 mg tablet for a total of 60 mg   BETHANECHOL (URECHOLINE) 10 MG TABLET    Take 20 mg by mouth 3 (three) times daily.   CARVEDILOL (COREG) 25 MG TABLET    Take 25 mg by mouth 2 (two) times daily with a meal. For CAD   ERGOCALCIFEROL (VITAMIN D2) 50000 UNITS CAPSULE    Take 50,000 Units by mouth every 30 (thirty) days. On the 5th of every month   ESOMEPRAZOLE (NEXIUM) 40 MG CAPSULE    Take 40 mg by mouth daily at 12 noon. For GERD   FUROSEMIDE (LASIX) 40 MG TABLET    Take 40 mg by mouth 2 (two) times daily. For CHF   INSULIN GLARGINE (LANTUS) 100 UNIT/ML INJECTION    Inject 25 Units into  the skin at bedtime. DO NOT MIX WITH OTHER INSULINS   LOSARTAN (COZAAR) 50 MG TABLET    Take 50 mg by mouth daily. For Hypertension   METHYLPHENIDATE (RITALIN) 5 MG TABLET    Take one tablet by mouth every morning before breakfast for depressive disorder   NUTRITIONAL SUPPLEMENTS (BOOST PO)    Take 237 mLs by mouth every evening.   OLOPATADINE HCL (PATADAY) 0.2 % SOLN    Apply to eye.   POLYETHYL GLYCOL-PROPYL GLYCOL (SYSTANE ULTRA OP)    Apply 1 drop to eye 3 (three) times daily. Both , wait 3-5 minutes between eye medications   PROMETHAZINE (PHENERGAN) 25 MG TABLET    Take 25 mg by mouth every 4 (four) hours as needed for nausea or vomiting (For nausea nad vomiting).  Modified Medications   No medications on file  Discontinued Medications   No medications on file     Physical Exam: Physical Exam  Constitutional: She is well-developed, well-nourished, and in no distress.  HENT:  Head: Normocephalic and atraumatic.  Mouth/Throat: Oropharynx is clear and moist. No oropharyngeal exudate.  Neck: Normal range of motion. Neck supple.  Cardiovascular: Normal rate, regular rhythm and normal heart sounds.   Pulmonary/Chest: Effort normal and breath sounds normal.  Abdominal: Soft. Bowel sounds are normal.  Musculoskeletal: She exhibits edema (wearing TED hose, +1 edema bilaterally) and tenderness (to knees bilaterally).  Self propels in [facility]  Neurological: She is alert.  Left sided hemiparesis   Skin: Skin is warm and dry.  Psychiatric: Affect normal.     Filed Vitals:   01/25/14 1808  BP: 129/76  Pulse: 74  Temp: 98 F (36.7 C)  Resp: 20  Weight: 238 lb (107.956 kg)      Labs reviewed: 08-18-12: chol 127; ldl 72; trig 92  10-06-12: wbc 7.3; hgb 10.0; hct 30.5 mcv 87.1; plt 232; glucose 46; bun 24; creat 1.51; k+4.2; na++143; liver normal albumin 3.4; hgb a1c 6.4; vit d 31  10-17-12: chol 127; ldl 76; trig 67  12-22-12: glucose 92; bun 24; creat 1.60; k+4.3; na++142  03-11-13:  glucose 103; bun 27; creat 1.62; k+3.9; na++ 144; liver normal albumin 3.7; hgb a1c 6.8  03-18-13: mirco-albumin: 1.07  CBC with Diff  Result: 05/27/2013 11:59 AM ( Status: F ) C  WBC 8.9 4.0-10.5 K/uL SLN  RBC 3.89 3.87-5.11 MIL/uL SLN  Hemoglobin 11.2 L 12.0-15.0 g/dL SLN  Hematocrit 33.9 L 36.0-46.0 % SLN  MCV 87.1 78.0-100.0 fL SLN  MCH 28.8 26.0-34.0 pg SLN  MCHC 33.0 30.0-36.0  g/dL SLN  RDW 13.9 11.5-15.5 % SLN  Platelet Count 250 150-400 K/uL SLN  Granulocyte % 64 43-77 % SLN  Absolute Gran 5.7 1.7-7.7 K/uL SLN  Lymph % 27 12-46 % SLN  Absolute Lymph 2.4 0.7-4.0 K/uL SLN  Mono % 6 3-12 % SLN  Absolute Mono 0.5 0.1-1.0 K/uL SLN  Eos % 3 0-5 % SLN  Absolute Eos 0.3 0.0-0.7 K/uL SLN  Baso % 0 0-1 % SLN  Absolute Baso 0.0 0.0-0.1 K/uL SLN  Smear Review Criteria for review not met SLN  Basic Metabolic Panel  Result: 10/28/7626 12:49 PM ( Status: F )  Sodium 139 135-145 mEq/L SLN  Potassium 3.8 3.5-5.3 mEq/L SLN  Chloride 102 96-112 mEq/L SLN  CO2 28 19-32 mEq/L SLN  Glucose 112 H 70-99 mg/dL SLN  BUN 25 H 6-23 mg/dL SLN  Creatinine 1.51 H 0.50-1.10 mg/dL SLN  Calcium 9.0 8.4-10.5 mg/dL SLN  Vitamin D (25-Hydroxy)  Result: 05/28/2013 2:21 AM ( Status: F )  Vitamin D (25-Hydroxy) 38 30-89 ng/mL SLN C  Comprehensive Metabolic Panel  Result: 07/20/1759 10:58 AM ( Status: F ) C  Sodium 146 H 135-145 mEq/L SLN  Potassium 3.5 3.5-5.3 mEq/L SLN  Chloride 106 96-112 mEq/L SLN  CO2 31 19-32 mEq/L SLN  Glucose 68 L 70-99 mg/dL SLN  BUN 24 H 6-23 mg/dL SLN  Creatinine 1.50 H 0.50-1.10 mg/dL SLN  Bilirubin, Total 0.5 0.2-1.2 mg/dL SLN  Alkaline Phosphatase 75 39-117 U/L SLN  AST/SGOT 12 0-37 U/L SLN  ALT/SGPT 15 0-35 U/L SLN  Total Protein 5.7 L 6.0-8.3 g/dL SLN  Albumin 3.4 L 3.5-5.2 g/dL SLN  Calcium 8.6 8.4-10.5 mg/dL SLN  Lipid Profile  Result: 07/29/2013 10:58 AM ( Status: F )  Cholesterol 112 0-200 mg/dL SLN C  Triglyceride 49 <150 mg/dL SLN  HDL Cholesterol 39 L >39  mg/dL SLN  Total Chol/HDL Ratio 2.9 Ratio SLN  VLDL Cholesterol (Calc) 10 0-40 mg/dL SLN  LDL Cholesterol (Calc) 63 0-99 mg/dL SLN C  Hemoglobin A1C  Result: 07/29/2013 2:44 PM ( Status: F )  Hemoglobin A1C 7.5 H <5.7 % SLN C  Estimated Average Glucose 169 H <117 mg/dL SLN  CBC NO Diff (Complete Blood Count)    Result: 11/18/2013 9:02 AM   ( Status: F )     C WBC 10.0     4.0-10.5 K/uL SLN   RBC 3.66   L 3.87-5.11 MIL/uL SLN   Hemoglobin 10.3   L 12.0-15.0 g/dL SLN   Hematocrit 31.3   L 36.0-46.0 % SLN   MCV 85.5     78.0-100.0 fL SLN   MCH 28.1     26.0-34.0 pg SLN   MCHC 32.9     30.0-36.0 g/dL SLN   RDW 14.2     11.5-15.5 % SLN   Platelet Count 252     150-400 K/uL SLN   Comprehensive Metabolic Panel    Result: 11/18/2013 11:28 AM   ( Status: F )       Sodium 143     135-145 mEq/L SLN   Potassium 3.6     3.5-5.3 mEq/L SLN   Chloride 105     96-112 mEq/L SLN   CO2 27     19-32 mEq/L SLN   Glucose 134   H 70-99 mg/dL SLN   BUN 27   H 6-23 mg/dL SLN   Creatinine 1.46   H 0.50-1.10 mg/dL SLN   Bilirubin, Total 0.4  0.2-1.2 mg/dL SLN   Alkaline Phosphatase 86     39-117 U/L SLN   AST/SGOT 13     0-37 U/L SLN   ALT/SGPT 17     0-35 U/L SLN   Total Protein 5.7   L 6.0-8.3 g/dL SLN   Albumin 3.3   L 3.5-5.2 g/dL SLN   Calcium 8.6     8.4-10.5 mg/dL SLN   Hemoglobin A1C    Result: 11/18/2013 11:25 AM   ( Status: F )       Hemoglobin A1C 7.6   H <5.7 % SLN C Estimated Average Glucose 171   H <117 mg/dL SLN  Lipid Profile    Result: 01/15/2011 12:03 PM   ( Status: F )     C Cholesterol 120     0-200 mg/dL SLN C Triglyceride 56     <150 mg/dL SLN   HDL Cholesterol 41     >39 mg/dL SLN   Total Chol/HDL Ratio 2.9      Ratio SLN   VLDL Cholesterol (Calc) 11     0-40 mg/dL SLN   LDL Cholesterol (Calc) 68     0-99 mg/dL SLN C Vitamin D (25-Hydroxy)    Result: 01/15/2011 3:34 PM   ( Status: F )       Vitamin D (25-Hydroxy) 18   L 30-89 ng/mL SLN C Assessment/Plan  1.  HYPERTENSION Stable on current medications   2. Gastroesophageal reflux disease without esophagitis Controlled at this time  3. Type II or unspecified type diabetes mellitus with neurological manifestations, not stated as uncontrolled(250.60) Blood sugars are stable, will need follow up A1c next month   4. CVA WITH LEFT HEMIPARESIS Stable, without worsening contractures or pain   5. Osteoarthrosis, unspecified whether generalized or localized, involving lower leg Pain is controlled on current medications   6. Vitamin D Def Will increase vit D 50000 units to once weekly

## 2014-01-27 ENCOUNTER — Encounter: Payer: No Typology Code available for payment source | Admitting: Internal Medicine

## 2014-02-22 ENCOUNTER — Other Ambulatory Visit: Payer: Self-pay | Admitting: *Deleted

## 2014-02-22 MED ORDER — METHYLPHENIDATE HCL 5 MG PO TABS: ORAL_TABLET | ORAL | Status: DC

## 2014-02-22 NOTE — Telephone Encounter (Signed)
[facility] 

## 2014-03-08 ENCOUNTER — Ambulatory Visit (INDEPENDENT_AMBULATORY_CARE_PROVIDER_SITE_OTHER): Payer: PRIVATE HEALTH INSURANCE | Admitting: Internal Medicine

## 2014-03-08 ENCOUNTER — Encounter: Payer: Self-pay | Admitting: Internal Medicine

## 2014-03-08 VITALS — BP 124/76 | HR 85 | Ht 67.0 in

## 2014-03-08 DIAGNOSIS — Z950 Presence of cardiac pacemaker: Secondary | ICD-10-CM

## 2014-03-08 NOTE — Patient Instructions (Signed)
Your physician recommends that you schedule a follow-up appointment as needed   Patient does not have a pacemaker

## 2014-03-09 ENCOUNTER — Non-Acute Institutional Stay (SKILLED_NURSING_FACILITY): Payer: PRIVATE HEALTH INSURANCE | Admitting: Internal Medicine

## 2014-03-09 DIAGNOSIS — N183 Chronic kidney disease, stage 3 (moderate): Secondary | ICD-10-CM

## 2014-03-09 DIAGNOSIS — I5020 Unspecified systolic (congestive) heart failure: Secondary | ICD-10-CM

## 2014-03-09 DIAGNOSIS — E1121 Type 2 diabetes mellitus with diabetic nephropathy: Secondary | ICD-10-CM

## 2014-03-10 ENCOUNTER — Encounter: Payer: Self-pay | Admitting: Internal Medicine

## 2014-03-10 NOTE — Assessment & Plan Note (Signed)
She has class 2 diastolic heart failure symptoms. She does not have a device in place. No indication for a device at the present time. We will see her back on an as needed basis in our arrhythmia clinic.

## 2014-03-10 NOTE — Assessment & Plan Note (Signed)
The patient has no device present.

## 2014-03-10 NOTE — Progress Notes (Signed)
HPI [name] is referred today by Dr. Quentin Cornwall for PPM followup. She has multiple medical problems including an old stroke and difficulty with speech, dementia, HTN, remote DVT/PE, DM, and depression. I have reviewed a CXR from 2010 which demonstrated old LV epicardial pacing leads and no PM generator. She had a mitral valve repair in 2005. I suspect her pacing leads were placed at that time presumably because it was thought that she would need a BiV ICD in the future. She denies syncope.  Allergies  Allergen Reactions  . Lotensin [Benazepril Hcl] Other (See Comments)    Unknown reaction     Current Outpatient Prescriptions  Medication Sig Dispense Refill  . acetaminophen (TYLENOL) 325 MG tablet Take 650 mg by mouth daily. DO NOT EXCEED 4 GMS OF TYLENOL IN 24 HOURS    . amLODipine (NORVASC) 5 MG tablet Take 5 mg by mouth daily. For HTN    . aspirin EC 81 MG tablet Take 81 mg by mouth daily.    Marland Kitchen atorvastatin (LIPITOR) 20 MG tablet Take one daily  with 40 mg tablet for a total of 60 mg    . atorvastatin (LIPITOR) 40 MG tablet Take one daily with 20 mg tablet for a total of 60 mg    . bethanechol (URECHOLINE) 10 MG tablet Take 20 mg by mouth 3 (three) times daily.    . carvedilol (COREG) 25 MG tablet Take 25 mg by mouth 2 (two) times daily with a meal. For CAD    . ergocalciferol (VITAMIN D2) 50000 UNITS capsule Take 1 capsule (50,000 Units total) by mouth once a week. On the 5th of every month (Patient taking differently: Take 50,000 Units by mouth once a week. ) 4 capsule   . esomeprazole (NEXIUM) 40 MG capsule Take 40 mg by mouth daily at 12 noon. For GERD    . furosemide (LASIX) 40 MG tablet Take 40 mg by mouth 2 (two) times daily. For CHF    . insulin glargine (LANTUS) 100 UNIT/ML injection Inject 25 Units into the skin at bedtime. DO NOT MIX WITH OTHER INSULINS    . linagliptin (TRADJENTA) 5 MG TABS tablet Take 5 mg by mouth daily.    Marland Kitchen losartan (COZAAR) 50 MG tablet Take 50 mg  by mouth daily. For Hypertension    . methylphenidate (RITALIN) 5 MG tablet Take one tablet by mouth every morning before breakfast for depressive disorder 30 tablet 0  . Nutritional Supplements (BOOST PO) Take 237 mLs by mouth every evening.    . Olopatadine HCl (PATADAY) 0.2 % SOLN Place 1 drop into both eyes daily.     Vladimir Faster Glycol-Propyl Glycol (SYSTANE ULTRA OP) Apply 1 drop to eye 3 (three) times daily. Both , wait 3-5 minutes between eye medications    . promethazine (PHENERGAN) 25 MG tablet Take 25 mg by mouth every 4 (four) hours as needed for nausea or vomiting (For nausea nad vomiting).     No current facility-administered medications for this visit.     Past Medical History  Diagnosis Date  . HYPERTENSION 02/14/2006    Qualifier: Diagnosis of  By: Oretha Ellis    . PE 12/31/2008    Qualifier: Diagnosis of  By: Elie Confer PharmD, Ulice Dash    . MITRAL REGURGITATION 05/23/2006    Annotation: S/P valve repair Qualifier: Diagnosis of  By: Oretha Ellis    . CARDIOMYOPATHY 05/23/2006    Annotation: Likely 2/2 EtOH, DM and HTN  Qualifier: Diagnosis of  By: Oretha Ellis    . CONGESTIVE HEART FAILURE 02/14/2006    Qualifier: Diagnosis of  By: Oretha Ellis    . DVT 12/31/2008    Qualifier: Diagnosis of  By: Caryn Section MD, Adwait    . Type II or unspecified type diabetes mellitus with neurological manifestations, not stated as uncontrolled 02/23/2013  . PERIPHERAL NEUROPATHY 11/18/2006    Qualifier: Diagnosis of  By: Hilma Favors  DO, Beth    . DECUBITUS ULCER, BUTTOCK 02/14/2009    Qualifier: Diagnosis of  By: Cathren Laine MD, Ankit    . RENAL INSUFFICIENCY 05/23/2006    Qualifier: Diagnosis of  By: Oretha Ellis    . HYPOKALEMIA 11/26/2006    Qualifier: Diagnosis of  By: Hilma Favors  DO, Beth    . PACEMAKER, PERMANENT 05/23/2006    Qualifier: Diagnosis of  By: Oretha Ellis    . GERD (gastroesophageal reflux disease)   . Hyperlipidemia   . Diverticular disease   .  Depression with anxiety   . Anemia   . Lower extremity edema     chronic  . CKD (chronic kidney disease)   . Neuropathy   . Diabetes mellitus, type II   . Stroke 2010    Resultant hemiplegia & dysphagia  . Hemiplegia   . Dysphagia   . Breast mass   . Ovarian cancer   . Cardiac pacemaker in situ   . MI (myocardial infarction)   . Colon polyp     hyperplastic and adenomatous    ROS:   All systems reviewed and negative except as noted in the HPI.   Past Surgical History  Procedure Laterality Date  . Pacemaker insertion    . Cataract extraction Bilateral   . Coronary artery bypass graft       History reviewed. No pertinent family history.   History   Social History  . Marital Status: Legally Separated    Spouse Name: N/A    Number of Children: N/A  . Years of Education: N/A   Occupational History  . Not on file.   Social History Main Topics  . Smoking status: Former Research scientist (life sciences)  . Smokeless tobacco: Not on file  . Alcohol Use: Not on file  . Drug Use: Not on file  . Sexual Activity: Not on file   Other Topics Concern  . Not on file   Social History Narrative     BP 124/76 mmHg  Pulse 85  Ht 5\' 7"  (1.702 m)  Physical Exam:  Chronically ill appearing middle aged woman, looking older than her stated aged, NAD HEENT: Unremarkable Neck:  No JVD, no thyromegally Lymphatics:  No adenopathy Back:  No CVA tenderness Lungs:  Clear with no wheezes.  HEART:  IRegular rate rhythm, no murmurs, no rubs, no clicks. Chest demonstrates a median sternotomy incision and a small transverse chest incision with no evidence of a pacing system underneath Abd:  soft, positive bowel sounds, no organomegally, no rebound, no guarding Ext:  2 plus pulses, no edema, no cyanosis, no clubbing Skin:  No rashes no nodules Neuro:  CN II through XII intact, motor grossly intact   Assess/Plan:

## 2014-03-11 NOTE — Progress Notes (Addendum)
Patient ID: [name], female   DOB: [birth year], 69 y.o.   MRN: [id number]              PROGRESS NOTE  DATE:  03/09/2014    FACILITY: [facility]    LEVEL OF CARE:   SNF   Routine Visit   CHIEF COMPLAINT:  Review of medical issues, [employer] visit.    HISTORY OF PRESENT ILLNESS:  This is a patient who has been in the building since 2010, I believe.    She has a history of having had a mitral valve replacement and also a cardiomyopathy.  She has not had any recent cardiac issues.     She suffered a significant right CVA with left hemiparesis in July 2010.  There have been no major changes.  CT scan in 2010 showed a remote right frontal infarct.  Echocardiogram showed an EF of 40-45% with diffuse hypokinesis.     She is also a type 2 diabetic.    PAST MEDICAL HISTORY/PROBLEM LIST:    Status post mitral valve repair.    Chronic systolic heart failure.  This has been stable for some time.    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  Last hemoglobin A1c was 6.6 on 02/05/2014.  Creatinine was 1.46 on 11/18/2013.    Hyperlipidemia.    History of PE.    History of peripheral neuropathy.    CURRENT MEDICATIONS:    Enteric-coated aspirin 81 q.d.    Cozaar 50 q.d.    Nexium 40 q.d.    Norvasc 5 q.d.    Vitamin D2, 50,000 U weekly.    Ritalin 5 mg every morning.  I am not sure of the history here  Carvedilol 25 b.i.d.    Lasix 40 twice a day.     Urecholine 20 mg three times a day.  Justification is not clear  Lipitor 20 q.d.    Lantus 25 U at night.    Tradjenta 5 mg daily.    LABORATORY DATA:  Recent lab work from 02/05/2014 showed:    Hemoglobin A1c 6.6.       BUN 29, creatinine 1.86.    PHYSICAL EXAMINATION:   VITAL SIGNS:   TEMPERATURE:  97.6.   PULSE:  78.   RESPIRATIONS:  18.   BLOOD PRESSURE:  122/60.   WEIGHT:  236 pounds.   CHEST/RESPIRATORY:  Clear air entry bilaterally.   CARDIOVASCULAR:  CARDIAC:   Heart sounds are normal.   There are no murmurs.    GASTROINTESTINAL:  ABDOMEN:   Soft.   LIVER/SPLEEN/KIDNEYS:  No liver, no spleen.  No tenderness.   NEUROLOGICAL:      SENSATION/STRENGTH:  Left hemiparesis.    ASSESSMENT/PLAN:  Type 2 diabetes.  This is well controlled.     CHF.  This has been stable for quite some time.    Chronic renal disease.    Hyperlipidemia.  Also well controlled.    Gastroesophageal reflux disease.    Late-effect non-dominant hemisphere CVA.  On aspirin.

## 2014-03-12 ENCOUNTER — Encounter: Payer: Self-pay | Admitting: Internal Medicine

## 2014-03-12 ENCOUNTER — Ambulatory Visit (INDEPENDENT_AMBULATORY_CARE_PROVIDER_SITE_OTHER): Payer: PRIVATE HEALTH INSURANCE | Admitting: Internal Medicine

## 2014-03-12 VITALS — BP 140/70 | HR 80

## 2014-03-12 DIAGNOSIS — Z86010 Personal history of colonic polyps: Secondary | ICD-10-CM

## 2014-03-12 DIAGNOSIS — I639 Cerebral infarction, unspecified: Secondary | ICD-10-CM

## 2014-03-12 NOTE — Patient Instructions (Signed)
Please follow up with Dr. Perry as needed 

## 2014-03-12 NOTE — Progress Notes (Signed)
HISTORY OF PRESENT ILLNESS:  [name] is a 68 y.o. female with MULTIPLE SIGNIFICANT medical problems including, but not limited to, hypertension, deep vein thrombosis with history of pulmonary embolus, cardiomyopathy with congestive heart failure, long-standing diabetes with perform neuropathy, chronic renal insufficiency, prior stroke with significant debility, and obesity. She is sent today by the attending physician at her assisted living facility regarding possible surveillance colonoscopy. The patient is accompanied by her daughter. The patient did undergo routine colon cancer screening in January 2010. She was found to have multiple diminutive polyps (all less than 5 mm). These are mostly hyperplastic with one adenoma. The examination was complete and the preparation excellent. Mild diverticulosis noted. Otherwise normal exam. Unfortunately, since that time, the patient has suffered a devastating stroke and now resides full-time in assisted care facility. She is wheelchair-bound. She is on multiple medications as listed. Review of GI systems review is entirely negative. She does take Nexium for GERD. No active symptoms.   REVIEW OF SYSTEMS:  All non-GI ROS negative except for left hemiparesis  Past Medical History  Diagnosis Date  . HYPERTENSION 02/14/2006    Qualifier: Diagnosis of  By: Oretha Ellis    . PE 12/31/2008    Qualifier: Diagnosis of  By: Elie Confer PharmD, Ulice Dash    . MITRAL REGURGITATION 05/23/2006    Annotation: S/P valve repair Qualifier: Diagnosis of  By: Oretha Ellis    . CARDIOMYOPATHY 05/23/2006    Annotation: Likely 2/2 EtOH, DM and HTN Qualifier: Diagnosis of  By: Oretha Ellis    . CONGESTIVE HEART FAILURE 02/14/2006    Qualifier: Diagnosis of  By: Oretha Ellis    . DVT 12/31/2008    Qualifier: Diagnosis of  By: Caryn Section MD, Adwait    . Type II or unspecified type diabetes mellitus with neurological manifestations, not stated as uncontrolled  02/23/2013  . PERIPHERAL NEUROPATHY 11/18/2006    Qualifier: Diagnosis of  By: Hilma Favors  DO, Beth    . DECUBITUS ULCER, BUTTOCK 02/14/2009    Qualifier: Diagnosis of  By: Cathren Laine MD, Ankit    . RENAL INSUFFICIENCY 05/23/2006    Qualifier: Diagnosis of  By: Oretha Ellis    . HYPOKALEMIA 11/26/2006    Qualifier: Diagnosis of  By: Hilma Favors  DO, Beth    . PACEMAKER, PERMANENT 05/23/2006    Qualifier: Diagnosis of  By: Oretha Ellis    . GERD (gastroesophageal reflux disease)   . Hyperlipidemia   . Diverticular disease   . Depression with anxiety   . Anemia   . Lower extremity edema     chronic  . CKD (chronic kidney disease)   . Neuropathy   . Diabetes mellitus, type II   . Stroke 2010    Resultant hemiplegia & dysphagia  . Hemiplegia   . Dysphagia   . Breast mass   . Ovarian cancer   . Cardiac pacemaker in situ   . MI (myocardial infarction)   . Colon polyp     hyperplastic and adenomatous    Past Surgical History  Procedure Laterality Date  . Pacemaker insertion    . Cataract extraction Bilateral   . Coronary artery bypass graft      Social History [name]  reports that she has quit smoking. She has never used smokeless tobacco. Her alcohol and drug histories are not on file.  family history is not on file.  Allergies  Allergen Reactions  . Lotensin [Benazepril Hcl] Other (See  Comments)    Unknown reaction       PHYSICAL EXAMINATION: Vital signs: BP 140/70 mmHg  Pulse 80  Ht   Wt  General: Chronically ill-appearing, well-nourished, no acute distress. Obese. In wheelchair. Contractured left upper extremity. HEENT: Sclerae are anicteric, conjunctiva pink. Oral mucosa intact Lungs: Clear Heart: Regular Abdomen: soft,obese, nontender, nondistended, no obvious ascites, no peritoneal signs, normal bowel sounds. No organomegaly. Extremities:  trace edema on the left Psychiatric: alert and oriented x3. Cooperative, was slow  speech   ASSESSMENT:  #97. 68 year old female who is sent today regarding surveillance colonoscopy. Colonoscopy in 2010 as described. #2. GERD. Stable on PPI #3. MULTIPLE SIGNIFICANT medical problems   PLAN:  #1. I had a candid discussion today with the patient and her daughter regarding the risk benefit ratio of surveillance colonoscopy in this particular clinical setting. I reviewed previous findings from her last colonoscopy including the fact that the polyps were small, mostly non-adenomatous, and that the examination was complete with excellent preparation. Overall, I do not feel that she is an appropriate candidate for routine surveillance as the risk is not justifiable. Fortunately she is having no clinical symptoms. They understand the issues at hand and agree that routine surveillance colonoscopy is not clinically appropriate. They will return to the care of their supervising physician at the assisted care facility. She will continue on PPI or chronic GERD

## 2014-03-23 ENCOUNTER — Other Ambulatory Visit: Payer: Self-pay | Admitting: *Deleted

## 2014-03-23 MED ORDER — METHYLPHENIDATE HCL 5 MG PO TABS: ORAL_TABLET | ORAL | Status: DC

## 2014-03-23 NOTE — Telephone Encounter (Signed)
[facility] 

## 2014-04-23 ENCOUNTER — Other Ambulatory Visit: Payer: Self-pay | Admitting: *Deleted

## 2014-04-23 MED ORDER — METHYLPHENIDATE HCL 5 MG PO TABS: ORAL_TABLET | ORAL | Status: DC

## 2014-04-23 NOTE — Telephone Encounter (Signed)
[facility] 

## 2014-05-11 ENCOUNTER — Non-Acute Institutional Stay (SKILLED_NURSING_FACILITY): Payer: Medicare Other | Admitting: Internal Medicine

## 2014-05-11 DIAGNOSIS — E1121 Type 2 diabetes mellitus with diabetic nephropathy: Secondary | ICD-10-CM

## 2014-05-11 DIAGNOSIS — I5020 Unspecified systolic (congestive) heart failure: Secondary | ICD-10-CM

## 2014-05-11 DIAGNOSIS — N183 Chronic kidney disease, stage 3 (moderate): Secondary | ICD-10-CM

## 2014-05-13 NOTE — Progress Notes (Addendum)
Patient ID: [name], female   DOB: [birth year], 69 y.o.   MRN: [id number]              PROGRESS NOTE  DATE:  05/11/2014            FACILITY: [facility]     LEVEL OF CARE:   SNF   Routine Visit   CHIEF COMPLAINT:  Review of medical issues/[employer] visit.    HISTORY OF PRESENT ILLNESS:  This is a patient who has been in the building since 2010.  She had a significant right CVA with left hemiparesis in July 2010.  Echocardiogram at the time showed an EF of 40-45% with diffuse hypokinesis.  She is a type 2 diabetic, on oral agents.    The patient has not had any recent cardiac issues.  She does not state that she has any chest tightness or pain.  No shortness of breath.     PAST MEDICAL HISTORY/PROBLEM LIST:              Status post mitral valve repair.    Chronic systolic heart failure.  This has been stable.    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  Last creatinine was 1.46 on 11/18/2013.    Hyperlipidemia.    History of PE.    History of diabetic peripheral neuropathy.     CURRENT MEDICATIONS:   Medication list is reviewed.     Bayer aspirin 81 q.d.    Cozaar 50 q.d.    Nexium 40 mg twice daily.    Norvasc 5 q.d.    Vitamin D2, 50,000 U daily.       Ritalin 5 mg every morning.    Coreg 25 twice a day.    Lasix 40 mg twice a day.    Urecholine 20 mEq three times daily.    Systane ophthalmic p.r.n.     Lipitor 60 mg daily.    Lantus insulin 25 U at night.    Tradjenta 5 mg daily.    LABORATORY DATA:  Hemoglobin A1c on 02/24/2014 was 6.7.    PHYSICAL EXAMINATION:    CHEST/RESPIRATORY:  Exam is clear.   CARDIOVASCULAR:  CARDIAC:   Heart sounds are normal.  There are no murmurs.  No gallops.  No signs of congestive heart failure.    NEUROLOGICAL:    SENSATION/STRENGTH:  She has left hemiparesis, unchanged for quite a long period of time.    ASSESSMENT/PLAN:                         Type 2 diabetes.  On insulin.  This is well  controlled.    Diabetic chronic renal failure.  This has been stable.    Congestive heart failure with ischemic cardiomyopathy.  This is stable.     Late Effect non dominant CVA. She has been neurologically stable for some time now

## 2014-05-31 ENCOUNTER — Other Ambulatory Visit: Payer: Self-pay | Admitting: *Deleted

## 2014-05-31 MED ORDER — METHYLPHENIDATE HCL 5 MG PO TABS: ORAL_TABLET | ORAL | Status: DC

## 2014-05-31 NOTE — Telephone Encounter (Signed)
[facility] 

## 2014-06-01 ENCOUNTER — Non-Acute Institutional Stay (SKILLED_NURSING_FACILITY): Payer: Medicare Other | Admitting: Internal Medicine

## 2014-06-01 DIAGNOSIS — L02213 Cutaneous abscess of chest wall: Secondary | ICD-10-CM

## 2014-06-04 NOTE — Progress Notes (Signed)
Patient ID: [name], female   DOB: Sep 24, 1945, 69 y.o.   MRN: [id number]                PROGRESS NOTE  DATE:  06/01/2014  FACILITY: [facility]                                LEVEL OF CARE:   SNF   Acute Visit   CHIEF COMPLAINT:  Erythematous nodule on her left breast.    HISTORY OF PRESENT ILLNESS:  I was asked to look at [name] today, who apparently has been discovered by the staff to have a nodule in her left breast.  The area is in the superior aspect of her breast at roughly 1 o'clock.  I actually think this is a skin issue, not actually a breast issue at all.  There is an erythematous, tender nodule at about 1 o'clock in the upper aspect of her breast.  This is well away from a previous surgical scar and her previous cardiac surgery (mitral valve repair in 2005).  I think the scar is actually from pacing leads placed at the time of her surgery, in reviewing her recent cardiology notes.  It was felt that she would need an ICD in the future.  However, she has not had any further issues.    PHYSICAL EXAMINATION:    BREASTS:  Left breast:  As noted, there is no real true breast mass that I can feel.  She has no axillary adenopathy.  There is a tender nodule at the lateral aspect of her surgical incision which I think was for epicardial pacemaker wires.  Whether this has anything to do with this or not, I am really uncertain.  This feels like a superficial skin abscess, although there is certainly no head on this.  No purulent drainage.  I am going to place her on antibiotics and follow this clinically.    ASSESSMENT/PLAN:                Skin abscess.  For now, I am going to put her on doxycycline and follow this along clinically.     CPT CODE: 50539

## 2014-06-09 ENCOUNTER — Other Ambulatory Visit: Payer: Self-pay | Admitting: Internal Medicine

## 2014-06-09 DIAGNOSIS — R799 Abnormal finding of blood chemistry, unspecified: Secondary | ICD-10-CM

## 2014-06-09 DIAGNOSIS — R7982 Elevated C-reactive protein (CRP): Secondary | ICD-10-CM

## 2014-06-15 ENCOUNTER — Ambulatory Visit (HOSPITAL_COMMUNITY)
Admission: RE | Admit: 2014-06-15 | Discharge: 2014-06-15 | Disposition: A | Discharge status: Home / Self Care | Payer: Medicare Other | Source: Ambulatory Visit | Attending: Internal Medicine | Admitting: Internal Medicine

## 2014-06-15 ENCOUNTER — Non-Acute Institutional Stay (SKILLED_NURSING_FACILITY): Payer: Medicare Other | Admitting: Internal Medicine

## 2014-06-15 DIAGNOSIS — R799 Abnormal finding of blood chemistry, unspecified: Secondary | ICD-10-CM

## 2014-06-15 DIAGNOSIS — R944 Abnormal results of kidney function studies: Secondary | ICD-10-CM | POA: Insufficient documentation

## 2014-06-15 DIAGNOSIS — R7982 Elevated C-reactive protein (CRP): Secondary | ICD-10-CM

## 2014-06-15 DIAGNOSIS — L02213 Cutaneous abscess of chest wall: Secondary | ICD-10-CM

## 2014-06-18 NOTE — Progress Notes (Addendum)
Patient ID: [name], female   DOB: 1946-02-02, 69 y.o.   MRN: [id number]                PROGRESS NOTE  DATE:  06/15/2014                      FACILITY: [facility]                          LEVEL OF CARE:   SNF   Acute Visit                                CHIEF COMPLAINT:  Follow up nodule on her left breast.     HISTORY OF PRESENT ILLNESS:  I saw this lady two weeks ago for this same issue.  At about 1 o'clock on the upper aspect of her left breast was a raised nodule that was tender.  I put her on doxycycline, although this has not gotten any better.  This is well removed from her previous surgical scar, which I think was from pacing leads at the time of her mitral valve repair in 2005.    PHYSICAL EXAMINATION:   BREASTS:  Left breast:  There is a nodule at roughly 1 o'clock.  This was cleaned and anesthetized with topical Lidocaine.  Using a #15 scalpel, I opened the area and obtained a moderate amount of purulent material.    LYMPHADENOPATHY:  She has no axillary adenopathy.    ASSESSMENT/PLAN:                             Skin abscess versus sebaceous cyst.  This was I&D'd as noted above.  A culture was done.  We will apply topical Bactroban and allow this to drain.  Follow up with the culture.

## 2014-06-28 ENCOUNTER — Other Ambulatory Visit: Payer: Self-pay | Admitting: *Deleted

## 2014-06-28 MED ORDER — METHYLPHENIDATE HCL 5 MG PO TABS: ORAL_TABLET | ORAL | Status: DC

## 2014-06-28 NOTE — Telephone Encounter (Signed)
[facility] 

## 2014-07-13 ENCOUNTER — Non-Acute Institutional Stay (SKILLED_NURSING_FACILITY): Payer: Medicare Other | Admitting: Internal Medicine

## 2014-07-13 DIAGNOSIS — E1121 Type 2 diabetes mellitus with diabetic nephropathy: Secondary | ICD-10-CM | POA: Diagnosis not present

## 2014-07-13 DIAGNOSIS — I5020 Unspecified systolic (congestive) heart failure: Secondary | ICD-10-CM

## 2014-07-17 NOTE — Progress Notes (Addendum)
Patient ID: [name], female   DOB: 05-04-1946, 69 y.o.   MRN: [id number]                PROGRESS NOTE  DATE:  07/13/2014             FACILITY: [facility]                            LEVEL OF CARE:   SNF   Routine Visit                          CHIEF COMPLAINT:  Review of medical issues/[employer] visit.       HISTORY OF PRESENT ILLNESS:  This is a patient who has been in the building since 2010.  She had a right CVA with left hemiparesis that year in July.  Echocardiogram at the time showed an EF of 40-45% with diffuse hypokinesis.    She is a type 2 diabetic, on oral agents.    I note that she follows with Psychiatry for depression, had Depakote increased.    I saw her early in February for a small abscess on her left breast, which I opened.  Culture was negative.  I think this was a sebaceous cyst.    PAST MEDICAL HISTORY/PROBLEM LIST:    Mitral valve repair.      Chronic systolic heart failure secondary to an ischemic cardiomyopathy.    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   Last BUN and creatinine were 41 and 2.2, respectively.  Her Lasix was reduced, although I do not see that this has been followed.  Renal ultrasound on 06/15/2014 showed normal renal ultrasound.       CURRENT MEDICATIONS:  Medication list is reviewed.                     Enteric-coated aspirin 81 q.d.          Cozaar 50 q.d.         Nexium 40 q.d.        Norvasc 5 q.d.        Tylenol 650 at 2 p.m. daily.      Vitamin D2, 50,000 U weekly on Thursdays.     Ritalin 5 mg every morning before breakfast.     Lasix 60 mg in the morning and 40 in the evening.    Carvedilol 25 twice daily.        ______ 20 mg three times a day.    Lipitor 60 q.h.s.        Lantus insulin 25 U at night.    Tradjenta 5 mg daily.      REVIEW OF SYSTEMS:    CHEST/RESPIRATORY:  No shortness of breath.     CARDIAC:  No chest pain.     GI:  No nausea, vomiting, or abdominal pain.    PHYSICAL  EXAMINATION:   GENERAL APPEARANCE:  The patient looks well.  Knows my name.   Engages me clearly.  I see no evidence of depression here.   CHEST/RESPIRATORY:  Exam is clear.        CARDIOVASCULAR:   CARDIAC:  Heart sounds are normal.  There are no murmurs.   No signs of congestive heart failure.    NEUROLOGICAL:    SENSATION/STRENGTH:  She has left hemiparesis, which is chronic.      ASSESSMENT/PLAN:  Type 2 diabetes with diabetic chronic renal failure.  She is still on a fair amount of Lasix, although I have not recently seen follow-up lab work.  This was last checked on 06/04/2014 at 41 and 2.2.  At that point, her Lasix was decreased to 40 b.i.d.     There was supposed to have been a CMP on 07/07/2014, although I do not see this result.

## 2014-07-26 ENCOUNTER — Other Ambulatory Visit: Payer: Self-pay | Admitting: *Deleted

## 2014-07-26 MED ORDER — METHYLPHENIDATE HCL 5 MG PO TABS: ORAL_TABLET | ORAL | Status: DC

## 2014-07-26 NOTE — Telephone Encounter (Signed)
[facility] 

## 2014-08-24 ENCOUNTER — Non-Acute Institutional Stay (SKILLED_NURSING_FACILITY): Payer: Medicare Other | Admitting: Internal Medicine

## 2014-08-24 DIAGNOSIS — E1121 Type 2 diabetes mellitus with diabetic nephropathy: Secondary | ICD-10-CM

## 2014-08-24 DIAGNOSIS — I5020 Unspecified systolic (congestive) heart failure: Secondary | ICD-10-CM

## 2014-08-24 DIAGNOSIS — N183 Chronic kidney disease, stage 3 (moderate): Secondary | ICD-10-CM

## 2014-08-29 NOTE — Progress Notes (Signed)
Patient ID: [name], female   DOB: 1946-03-22, 69 y.o.   MRN: [id number]                PROGRESS NOTE  DATE:  08/24/2014             FACILITY: [facility]                             LEVEL OF CARE:   SNF   Routine Visit                           CHIEF COMPLAINT:  Review of medical issues/[employer] visit.       HISTORY OF PRESENT ILLNESS:  This is a patient who came here in 2010 after suffering a right CVA with left hemiparesis.    An echocardiogram at the time showed an EF of 40-45% with diffuse hypokinesis.    The patient is a type 2 diabetic, on oral agents.  She has chronic renal insufficiency related to that.  She had an ultrasound of her kidneys in February of this year that was negative.  Her most recent hemoglobin A1c on 08/06/2014 was 6.2.     PAST MEDICAL HISTORY/PROBLEM LIST:                       Mitral valve repair.     Chronic systolic heart failure secondary to an ischemic cardiomyopathy.       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  Last creatinine was 1.73 on 08/06/2014.     CURRENT MEDICATIONS:  Medication list is reviewed.       Enteric-coated aspirin 81 q.d.        Cozaar 50 q.d.        Nexium 40 q.d.       Norvasc 5 q.d.       Vitamin D2, 50,000 U monthly.      Ritalin 5 mg every morning before breakfast.    Lasix 60 mg in the morning and 40 mg in the evening.     Carvedilol 25 b.i.d.       Urecholine 20 mg three times a day.    Lipitor 60 q.d.         Lantus 25 U at night.        Tradjenta 5 mg daily.         REVIEW OF SYSTEMS:    CHEST/RESPIRATORY:  No shortness of breath.    CARDIAC:  No chest pain.       GI:  No nausea, vomiting, or abdominal pain.     MUSCULOSKELETAL:   She is complaining of pain in her lower legs bilaterally.     PHYSICAL EXAMINATION:   VITAL SIGNS:   O2 SATURATIONS:  96% on room air.       WEIGHT:   Her weight is listed as 246 pounds.   That is up four pounds since last month.     GENERAL  APPEARANCE:  The patient is in no distress.     CHEST/RESPIRATORY:  Clear air entry bilaterally.    CARDIOVASCULAR:   CARDIAC:  Heart sounds are normal.   EDEMA/VARICOSITIES:  She has 2+ pitting edema in her bilateral lower legs.    No evidence of a DVT.      GASTROINTESTINAL:   ABDOMEN:  Soft.  There are no masses.  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    Her creatinine is stable.   Hemoglobin A1c is well controlled at 6.2 earlier this month.     Late-effect non-dominant hemisphere CVA.  She continues on aspirin.  Her blood pressure is well controlled.    I am not quite sure why she needs to continue on Ritalin.    Probable ischemic cardiomyopathy.  There is no convincing evidence of heart failure at present.  We will need to watch her weights.      CPT CODE: [id number]

## 2014-08-30 ENCOUNTER — Other Ambulatory Visit: Payer: Self-pay | Admitting: *Deleted

## 2014-08-30 MED ORDER — METHYLPHENIDATE HCL 5 MG PO TABS: ORAL_TABLET | ORAL | Status: DC

## 2014-08-30 NOTE — Telephone Encounter (Signed)
[facility] 

## 2014-09-30 ENCOUNTER — Other Ambulatory Visit: Payer: Self-pay | Admitting: *Deleted

## 2014-09-30 MED ORDER — METHYLPHENIDATE HCL 5 MG PO TABS: ORAL_TABLET | ORAL | Status: DC

## 2014-09-30 NOTE — Telephone Encounter (Signed)
[facility] 

## 2014-10-05 ENCOUNTER — Non-Acute Institutional Stay (SKILLED_NURSING_FACILITY): Payer: Medicare Other | Admitting: Internal Medicine

## 2014-10-05 DIAGNOSIS — I5020 Unspecified systolic (congestive) heart failure: Secondary | ICD-10-CM | POA: Diagnosis not present

## 2014-10-05 DIAGNOSIS — E1121 Type 2 diabetes mellitus with diabetic nephropathy: Secondary | ICD-10-CM

## 2014-10-07 NOTE — Progress Notes (Addendum)
Patient ID: [name], female   DOB: 1945-11-29, 69 y.o.   MRN: [id number]                PROGRESS NOTE  DATE:  10/05/2014        FACILITY: [facility]                LEVEL OF CARE:   SNF   Routine Visit              CHIEF COMPLAINT:  Routine visit/[facility] visit.       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  An echocardiogram at the time showed an EF of 40-45% with diffuse hypokinesis.    The patient is a type 2 diabetic, on oral agents.   She has chronic renal failure related to that, with a recent creatinine of 1.73.  She had an ultrasound of her kidneys in February of this year that did not show hydronephrosis.  Her most recent hemoglobin A1c on 08/06/2014 was 6.2.    PAST MEDICAL HISTORY/PROBLEM LIST:               Mitral valve repair.    Chronic congestive heart failure secondary to an ischemic cardiomyopathy.    Type 2 diabetes with chronic renal failure.    CURRENT MEDICATIONS:  Medication list is reviewed.                    Enteric-coated aspirin 81 q.d.      Cozaar 50 q.d.       Nexium 40 q.d.       Norvasc 5 q.d.       Vitamin D2, 50,000 U weekly on Thursday.    Ritalin 5 mg in the morning before breakfast.    Carvedilol 25 b.i.d.       Lasix 40 q.d.      Tylenol 650 at 2 p.m.     Urecholine 20 mg three times a day.     Systane three times a day.    Lipitor 60 q.d.        Depakote 250 every night.    Insulin Lantus 22 U at night.    Tradjenta 5 mg daily.     LABORATORY DATA:   Labs from 08/06/2014:    Iron 40, TIBC 231, percent saturation 17.     White count 9.2, hemoglobin 10.5, platelet count 236.    BUN 23, creatinine 1.73.    Vitamin B12 normal.    Ferritin 59.    Hemoglobin A1c 6.2.       PHYSICAL EXAMINATION:   GENERAL APPEARANCE:  She is not in any distress.    CHEST/RESPIRATORY:  Clear air entry bilaterally.      CARDIOVASCULAR:   CARDIAC:  Heart sounds are normal.   EDEMA/VARICOSITIES:  Extremities:   2+ pitting edema in her bilateral legs.  No evidence of a DVT.    GASTROINTESTINAL:   ABDOMEN:  No masses.       ASSESSMENT/PLAN:                            Type 2 diabetes with chronic renal failure.  Her hemoglobin A1c was 6.2.  Her Lantus was reduced from 25 U to 22 U.    Late-effect non-dominant hemisphere CVA.  Her blood pressure appears to be well  controlled.  She continues on aspirin.  I am not quite sure why she needs to be on Ritalin.    Ischemic cardiomyopathy.  There is no convincing evidence of heart failure.

## 2014-11-02 ENCOUNTER — Other Ambulatory Visit: Payer: Self-pay | Admitting: *Deleted

## 2014-11-02 MED ORDER — METHYLPHENIDATE HCL 5 MG PO TABS: ORAL_TABLET | ORAL | Status: DC

## 2014-11-02 NOTE — Telephone Encounter (Signed)
[facility] 

## 2014-11-30 ENCOUNTER — Non-Acute Institutional Stay (SKILLED_NURSING_FACILITY): Payer: Medicare Other | Admitting: Internal Medicine

## 2014-11-30 DIAGNOSIS — R339 Retention of urine, unspecified: Secondary | ICD-10-CM

## 2014-11-30 DIAGNOSIS — I5020 Unspecified systolic (congestive) heart failure: Secondary | ICD-10-CM | POA: Diagnosis not present

## 2014-11-30 DIAGNOSIS — N183 Chronic kidney disease, stage 3 (moderate): Secondary | ICD-10-CM

## 2014-11-30 DIAGNOSIS — E1121 Type 2 diabetes mellitus with diabetic nephropathy: Secondary | ICD-10-CM | POA: Diagnosis not present

## 2014-12-02 ENCOUNTER — Other Ambulatory Visit: Payer: Self-pay | Admitting: *Deleted

## 2014-12-02 MED ORDER — METHYLPHENIDATE HCL 5 MG PO TABS: ORAL_TABLET | ORAL | Status: DC

## 2014-12-02 NOTE — Telephone Encounter (Signed)
[facility] 

## 2014-12-08 NOTE — Progress Notes (Addendum)
Patient ID: [name], female   DOB: [birth year], 69 y.o.   MRN: [id number]                PROGRESS NOTE  DATE:   11/29/2014         FACILITY: [facility]                      LEVEL OF CARE:   SNF   Routine Visit                CHIEF COMPLAINT:  Routine visit/[facility] visit.      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  An echocardiogram at the time showed an EF of 40-45% with diffuse hypokinesis.    She is a type 2 diabetic with chronic renal failure, on oral agents.  Her most recent hemoglobin A1c on 08/06/2014 was 6.2.    The patient is followed by Psychiatry in the facility for depression, dementia with behavioral disturbances.  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    There have been no recent additional medical concerns.    PAST MEDICAL HISTORY/PROBLEM LIST:          Mitral valve repair.    Chronic congestive heart failure secondary to ischemic cardiomyopathy.    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    CURRENT MEDICATIONS:  Medication list is reviewed.             Bayer aspirin 81 q.d.      Cozaar 50 q.d.      Nexium 40 q.d.      Norvasc 5 q.d.      Vitamin D2, 50,000 U weekly.     Ritalin 5 mg in the morning before breakfast.    Carvedilol 25 b.i.d.      Lasix 40 q.d.     Tylenol 650 daily at 2 p.m.      Bethanechol 20 mg three times a day.    Lipitor 60 q.d.      Valproic acid 125 mg, 2 capsules/250 mg at h.s.     Tradjenta 5 mg daily.     Lantus 22 U at h.s.     LABORATORY DATA:   Recent lab work on 08/06/2014:      Creatinine 1.73.    White count 9.2, hemoglobin 10.5, platelet count 236.    Iron studies:  Serum iron 40, TIBC 231, ferritin level 59.     Hemoglobin A1c 6.2.      REVIEW OF SYSTEMS:    HEENT: no oral complaints, no lesions CHEST/RESPIRATORY:  The patient is not complaining of shortness of breath.    CARDIAC:  No chest pain.   GI:  No abdominal pain.  GU: no clear dysuria,hematuria  ENDOCRINE:   Blood sugars from 11/28/2014:  111, 121, 101, 166.  This would represent fairly decent control.     PHYSICAL EXAMINATION:   VITAL SIGNS:     TEMPERATURE:  97.6.    PULSE:  72.   RESPIRATIONS:  18.     BLOOD PRESSURE:  128/74.   CHEST/RESPIRATORY:  Clear air entry bilaterally.    CARDIOVASCULAR:   CARDIAC:  Heart sounds are normal.  There are no murmurs.    EDEMA/VARICOSITIES:  Extremities:  She has pitting edema bilaterally below the knees, although no other evidence of heart failure.  ARTERIAL:  She has difficult to feel peripheral pulses.  Her feet are cool.   GASTROINTESTINAL:   ABDOMEN:  Nontender.  No masses.    GU: no evidence of significant urinary retention at the bedside.   NEUROLOGICAL:    SENSATION/STRENGTH:  Chronic left-sided weakness.  Left hand contracture.   Mental Status: no overt depression. Attention is normal   ASSESSMENT/PLAN:                 Late-effect nondominant hemisphere CVA.  Her blood pressure appears to be under good control.  LDL was 78.  on ASA  History of urinary retention.  She is on Bethanechol.  I note recent reduction of the dose and I agree with this.  She does not appear to have bladder distention.    Type 2 diabetes with likely chronic renal failure, PAD, and neuropathy.  Her hemoglobin A1c is under good control. Her BP is under good control  On Ritalin a.c. breakfast.  I do not know that this is necessary.    Hyperlipidemia.  I think this is under fairly good control.    CHF: I see no evidence of this.

## 2014-12-31 ENCOUNTER — Other Ambulatory Visit: Payer: Self-pay

## 2014-12-31 MED ORDER — METHYLPHENIDATE HCL 5 MG PO TABS: ORAL_TABLET | ORAL | Status: DC

## 2015-01-31 ENCOUNTER — Other Ambulatory Visit: Payer: Self-pay | Admitting: *Deleted

## 2015-01-31 MED ORDER — METHYLPHENIDATE HCL 5 MG PO TABS: ORAL_TABLET | ORAL | Status: DC

## 2015-01-31 NOTE — Telephone Encounter (Signed)
[facility] 

## 2015-03-03 ENCOUNTER — Other Ambulatory Visit: Payer: Self-pay

## 2015-03-03 MED ORDER — METHYLPHENIDATE HCL 5 MG PO TABS: ORAL_TABLET | ORAL | Status: DC

## 2015-03-03 NOTE — Telephone Encounter (Signed)
Rx faxed to [facility] @ [phone], phone number [phone]  

## 2015-03-08 ENCOUNTER — Non-Acute Institutional Stay (SKILLED_NURSING_FACILITY): Payer: Medicare Other | Admitting: Internal Medicine

## 2015-03-08 DIAGNOSIS — N183 Chronic kidney disease, stage 3 (moderate): Secondary | ICD-10-CM

## 2015-03-08 DIAGNOSIS — I5020 Unspecified systolic (congestive) heart failure: Secondary | ICD-10-CM | POA: Diagnosis not present

## 2015-03-08 DIAGNOSIS — E1121 Type 2 diabetes mellitus with diabetic nephropathy: Secondary | ICD-10-CM | POA: Diagnosis not present

## 2015-03-15 NOTE — Progress Notes (Addendum)
Patient ID: [name], female   DOB: 03-17-1946, 69 y.o.   MRN: [id number]                PROGRESS NOTE  DATE:  03/08/2015           FACILITY: [facility]                   LEVEL OF CARE:   SNF   Routine Visit             CHIEF COMPLAINT:  Routine visit/[facility] visit/review of [facility] records.      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  An echocardiogram at the time showed an EF of 40-45% with diffuse hypokinesis.    She is a type 2 diabetic with chronic renal failure, on oral agents.    Her most recent hemoglobin A1c was 6.3 in October.  Most recent creatinine was 1.55.    She is followed by Psychiatry in the facility for depression, vascular dementia.  Her major depression appears to be more longstanding.    She is complaining about wheezing with no cough, no shortness of breath, and no chest pain.  I note that she was started on DuoNebs two weeks ago.    She was also seen by Psychiatry in September and started on Effexor for depression.    PAST MEDICAL HISTORY/PROBLEM LIST:                 Mitral valve repair.    Chronic congestive heart failure secondary to an ischemic cardiomyopathy.    Type 2 diabetes with chronic renal failure.     SOCIAL HISTORY:   HOUSING:  The patient has been here for six years.   ADVANCED DIRECTIVES:  She remains a Full Code.  There is no limitation on her care.     CURRENT MEDICATIONS:  Medication list is reviewed.             Enteric-coated aspirin 81 q.d.      Cozaar 50 q.d.     Nexium 40 q.d.      Norvasc 5 q.d.     Vitamin D2, 50,000 U monthly.      Ritalin 5 mg q.a.m.     Effexor 37.5 q.d., recently started in September for depression.      Carvedilol 25 b.i.d.      Lasix 40 q.d.      Tylenol 650 daily at 2 o'clock.    Systane ophthalmic three times a day.     Urecholine 10 mg three times a day.    Lipitor 60 mg q.d.       Valproic acid 250 q.h.s.      Lantus insulin 22 U at bedtime.    Tradjenta 5 mg daily.     REVIEW OF SYSTEMS:    GENERAL:  The patient states she feels reasonably well.   Appetite 75-100%.    HEENT:   No oral complaints.  No visual complaints.   CHEST/RESPIRATORY:  She is still complaining of wheezing.  As mentioned, she has had DuoNebs for two weeks p.r.n.        CARDIAC:  No chest pain.   GI:  No abdominal pain.   No diarrhea.   No swallowing difficulties.    GU:  No clear dysuria.    ENDOCRINE:  This morning's blood sugar was 175.  Mostly,  these seem to be in the low to upper 100s.  Nevertheless, her hemoglobin A1c is stable.     PHYSICAL EXAMINATION:   VITAL SIGNS:     PULSE:  72.   RESPIRATIONS:  18.   BLOOD PRESSURE:  128/68.   02 SATURATIONS:  97%.     WEIGHT:  240 pounds.    CHEST/RESPIRATORY:  Air entry is fairly good.  However, she has a prolonged expiratory phase and expiratory wheezing.        CARDIOVASCULAR:   CARDIAC:   Heart sounds are normal.   There are no murmurs.  JVP is not obviously elevated.    EDEMA/VARICOSITIES:  She has left greater than right edema at 2+, but no evidence of a DVT.   GASTROINTESTINAL:   ABDOMEN:  Obese.  No masses.  No tenderness.     GENITOURINARY:   BLADDER:  Not enlarged.    PSYCHIATRIC:   MENTAL STATUS:  She seems bright, alert.  Misses her roommate who went home two weeks ago.      ASSESSMENT/PLAN:           Late-effect nondominant hemisphere CVA.      Wheezing.   I think it is time for a chest x-ray.  I do not see any overt signs of heart failure at the bedside.    Type 2 diabetes with renal failure.  Her last creatinine at 1.55 is stable. Stage 3  Hemoglobin A1c is 6.3, excellent.

## 2015-05-03 ENCOUNTER — Other Ambulatory Visit: Payer: Self-pay | Admitting: *Deleted

## 2015-05-03 MED ORDER — METHYLPHENIDATE HCL 5 MG PO TABS: ORAL_TABLET | ORAL | Status: DC

## 2015-05-03 NOTE — Telephone Encounter (Signed)
[facility] 

## 2015-05-11 LAB — HEMOGLOBIN A1C: Hemoglobin A1C: 6.1

## 2015-06-02 ENCOUNTER — Other Ambulatory Visit: Payer: Self-pay

## 2015-06-02 MED ORDER — METHYLPHENIDATE HCL 5 MG PO TABS: ORAL_TABLET | ORAL | Status: DC

## 2015-06-20 ENCOUNTER — Encounter: Payer: Self-pay | Admitting: Internal Medicine

## 2015-06-20 ENCOUNTER — Non-Acute Institutional Stay (SKILLED_NURSING_FACILITY): Payer: Medicare Other | Admitting: Internal Medicine

## 2015-06-20 DIAGNOSIS — E1122 Type 2 diabetes mellitus with diabetic chronic kidney disease: Secondary | ICD-10-CM | POA: Diagnosis not present

## 2015-06-20 DIAGNOSIS — R05 Cough: Secondary | ICD-10-CM

## 2015-06-20 DIAGNOSIS — R059 Cough, unspecified: Secondary | ICD-10-CM

## 2015-06-20 DIAGNOSIS — N183 Chronic kidney disease, stage 3 (moderate): Secondary | ICD-10-CM | POA: Diagnosis not present

## 2015-06-20 DIAGNOSIS — I5032 Chronic diastolic (congestive) heart failure: Secondary | ICD-10-CM | POA: Diagnosis not present

## 2015-06-20 DIAGNOSIS — I10 Essential (primary) hypertension: Secondary | ICD-10-CM

## 2015-06-20 DIAGNOSIS — R609 Edema, unspecified: Secondary | ICD-10-CM

## 2015-06-20 DIAGNOSIS — F411 Generalized anxiety disorder: Secondary | ICD-10-CM

## 2015-06-20 DIAGNOSIS — E1149 Type 2 diabetes mellitus with other diabetic neurological complication: Secondary | ICD-10-CM

## 2015-06-20 DIAGNOSIS — I129 Hypertensive chronic kidney disease with stage 1 through stage 4 chronic kidney disease, or unspecified chronic kidney disease: Secondary | ICD-10-CM

## 2015-06-20 DIAGNOSIS — D509 Iron deficiency anemia, unspecified: Secondary | ICD-10-CM | POA: Diagnosis not present

## 2015-06-20 HISTORY — DX: Iron deficiency anemia, unspecified: D50.9

## 2015-06-20 HISTORY — DX: Edema, unspecified: R60.9

## 2015-06-20 MED ORDER — FUROSEMIDE 80 MG PO TABS
ORAL_TABLET | ORAL | Status: DC
Start: 2015-06-20 — End: 2017-05-23

## 2015-06-20 NOTE — Progress Notes (Deleted)
[place] Room Number: 206 A  Place of Service: SNF (31)     Allergies  Allergen Reactions  . Lotensin [Benazepril Hcl] Other (See Comments)    Unknown reaction    Chief Complaint  Patient presents with  . Acute Visit    Swelling in lower extremity    HPI:   Medications: Patient's Medications  New Prescriptions   No medications on file  Previous Medications   ACETAMINOPHEN (TYLENOL) 325 MG TABLET    Take 650 mg by mouth daily. DO NOT EXCEED 4 GMS OF TYLENOL IN 24 HOURS   AMLODIPINE (NORVASC) 5 MG TABLET    Take 5 mg by mouth daily. For HTN   ASPIRIN EC 81 MG TABLET    Take 81 mg by mouth daily.   ATORVASTATIN (LIPITOR) 40 MG TABLET    Take 40 mg by mouth daily.    BACLOFEN (LIORESAL) 10 MG TABLET    Take 10 mg by mouth 2 (two) times daily.   BENZONATATE (TESSALON) 100 MG CAPSULE    Take 100 mg by mouth every 8 (eight) hours as needed for cough.   BETHANECHOL (URECHOLINE) 10 MG TABLET    Take 10 mg by mouth 3 (three) times daily.    CARVEDILOL (COREG) 25 MG TABLET    Take 25 mg by mouth 2 (two) times daily with a meal. For CAD   DIVALPROEX (DEPAKOTE SPRINKLE) 125 MG CAPSULE    Take 250 mg by mouth at bedtime.   ERGOCALCIFEROL (VITAMIN D2) 50000 UNITS CAPSULE    Take 1 capsule (50,000 Units total) by mouth once a week. On the 5th of every month   ESOMEPRAZOLE (NEXIUM) 40 MG CAPSULE    Take 40 mg by mouth daily at 12 noon. For GERD   FUROSEMIDE (LASIX) 40 MG TABLET    Take 40 mg by mouth 2 (two) times daily. For CHF   GUAIFENESIN (ROBITUSSIN) 100 MG/5ML LIQUID    Take 15 mLs by mouth every 4 (four) hours as needed for cough.   INSULIN GLARGINE (LANTUS) 100 UNIT/ML INJECTION    Inject 22 Units into the skin at bedtime. DO NOT MIX WITH OTHER INSULINS   IPRATROPIUM-ALBUTEROL (DUONEB) 0.5-2.5 (3) MG/3ML SOLN    Take 3 mLs by nebulization every 6 (six) hours as needed.   LINAGLIPTIN (TRADJENTA) 5 MG TABS TABLET    Take 5 mg by mouth daily.    LOSARTAN (COZAAR) 50 MG TABLET    Take 50 mg by mouth daily. For Hypertension   METHYLPHENIDATE (RITALIN) 5 MG TABLET    Take one tablet by mouth every morning before breakfast for depressive disorder   NUTRITIONAL SUPPLEMENTS (BOOST PO)    Take 237 mLs by mouth every evening.   OLOPATADINE HCL (PATADAY) 0.2 % SOLN    Place 1 drop into both eyes daily.    POLYETHYL GLYCOL-PROPYL GLYCOL (SYSTANE ULTRA OP)    Apply 1 drop to eye 3 (three) times daily. Both , wait 3-5 minutes between eye medications   PROMETHAZINE (PHENERGAN) 25 MG TABLET    Take 25 mg by mouth every 4 (four) hours as needed for nausea or vomiting (For nausea nad vomiting).   VENLAFAXINE (EFFEXOR) 37.5 MG TABLET    Take 37.5 mg by mouth daily.  Modified Medications   No medications on file  Discontinued Medications   ATORVASTATIN (LIPITOR) 20 MG TABLET    Take one daily  with 40 mg tablet for a  total of 60 mg     Review of Systems  Filed Vitals:   06/20/15 1015  BP: 112/54  Pulse: 63  Temp: 98 F (36.7 C)  Resp: 22   Wt Readings from Last 3 Encounters:  01/25/14 238 lb (107.956 kg)  11/16/13 232 lb (105.235 kg)  10/19/13 232 lb (105.235 kg)    There is no weight on file to calculate BMI.  Physical Exam   Labs reviewed: No flowsheet data found. Lab Results  Component Value Date   TSH 0.115 Test methodology is 3rd generation TSH 02/24/2009   Lab Results  Component Value Date   BUN 5* 03/14/2009   BUN 7 02/25/2009   BUN 12 02/24/2009   Lab Results  Component Value Date   CREATININE 1.18 03/14/2009   CREATININE 1.32* 02/25/2009   CREATININE 1.42* 02/24/2009   Lab Results  Component Value Date   HGBA1C 6.1 05/11/2015   HGBA1C 7.2* 08/26/2013   HGBA1C 6.5 12/31/2008       Assessment/Plan

## 2015-06-20 NOTE — Progress Notes (Signed)
Patient ID: [name], female   DOB: 01/13/1946, 70 y.o.   MRN: [id number]    [place] Room Number: [id number]  Place of Service: SNF (31)     Allergies  Allergen Reactions  . Lotensin [Benazepril Hcl] Other (See Comments)    Unknown reaction    Chief Complaint  Patient presents with  . Acute Visit    Swelling in lower extremity    HPI:  I was asked see this patient acutely for increasing edema of feet. She denies shortness of breath or wheezing. Edema of the legs and feet is confirmed about 1+ bilaterally.  Has previous history congestive heart failure, but she is not especially short of breath.  She has a previous history of urinary tract infections, but denies dysuria  She is hypertensive, but this is under control. She is using Coreg, amlodipine, and losartan  There is a previous history of chronic renal disease that about stage III. This likely due to hypertension and diabetes mellitus. She needs follow-up lab.  There is previous history of anemia, with the latest hemoglobin at 10.5 g percent in April 2016.  She has hyperlipidemia which is treated with atorvastatin 40 mg.  Medications: Patient's Medications  New Prescriptions   No medications on file  Previous Medications   ACETAMINOPHEN (TYLENOL) 325 MG TABLET    Take 650 mg by mouth daily. DO NOT EXCEED 4 GMS OF TYLENOL IN 24 HOURS   AMLODIPINE (NORVASC) 5 MG TABLET    Take 5 mg by mouth daily. For HTN   ASPIRIN EC 81 MG TABLET    Take 81 mg by mouth daily.   ATORVASTATIN (LIPITOR) 40 MG TABLET    Take 40 mg by mouth daily.    BACLOFEN (LIORESAL) 10 MG TABLET    Take 10 mg by mouth 2 (two) times daily.   BENZONATATE (TESSALON) 100 MG CAPSULE    Take 100 mg by mouth every 8 (eight) hours as needed for cough.   BETHANECHOL (URECHOLINE) 10 MG TABLET    Take 10 mg by mouth 3 (three) times daily.    CARVEDILOL (COREG) 25 MG TABLET    Take 25 mg by mouth 2 (two) times daily with a  meal. For CAD   DIVALPROEX (DEPAKOTE SPRINKLE) 125 MG CAPSULE    Take 250 mg by mouth at bedtime.   ERGOCALCIFEROL (VITAMIN D2) 50000 UNITS CAPSULE    Take 1 capsule (50,000 Units total) by mouth once a week. On the 5th of every month   ESOMEPRAZOLE (NEXIUM) 40 MG CAPSULE    Take 40 mg by mouth daily at 12 noon. For GERD   FUROSEMIDE (LASIX) 40 MG TABLET    Take 40 mg by mouth 2 (two) times daily. For CHF   GUAIFENESIN (ROBITUSSIN) 100 MG/5ML LIQUID    Take 15 mLs by mouth every 4 (four) hours as needed for cough.   INSULIN GLARGINE (LANTUS) 100 UNIT/ML INJECTION    Inject 22 Units into the skin at bedtime. DO NOT MIX WITH OTHER INSULINS   IPRATROPIUM-ALBUTEROL (DUONEB) 0.5-2.5 (3) MG/3ML SOLN    Take 3 mLs by nebulization every 6 (six) hours as needed.   LINAGLIPTIN (TRADJENTA) 5 MG TABS TABLET    Take 5 mg by mouth daily.   LOSARTAN (COZAAR) 50 MG TABLET    Take 50 mg by mouth daily. For Hypertension   METHYLPHENIDATE (RITALIN) 5 MG TABLET    Take one tablet by mouth every morning  before breakfast for depressive disorder   NUTRITIONAL SUPPLEMENTS (BOOST PO)    Take 237 mLs by mouth every evening.   OLOPATADINE HCL (PATADAY) 0.2 % SOLN    Place 1 drop into both eyes daily.    POLYETHYL GLYCOL-PROPYL GLYCOL (SYSTANE ULTRA OP)    Apply 1 drop to eye 3 (three) times daily. Both , wait 3-5 minutes between eye medications   PROMETHAZINE (PHENERGAN) 25 MG TABLET    Take 25 mg by mouth every 4 (four) hours as needed for nausea or vomiting (For nausea nad vomiting).   VENLAFAXINE (EFFEXOR) 37.5 MG TABLET    Take 37.5 mg by mouth daily.  Modified Medications   No medications on file  Discontinued Medications   ATORVASTATIN (LIPITOR) 20 MG TABLET    Take one daily  with 40 mg tablet for a total of 60 mg     Review of Systems  Constitutional: Negative for fever, chills, diaphoresis, activity change, appetite change, fatigue and unexpected weight change.       Morbid obesity  HENT: Negative for  congestion, ear discharge, ear pain, hearing loss, postnasal drip, rhinorrhea, sore throat, tinnitus, trouble swallowing and voice change.   Eyes: Negative for pain, redness, itching and visual disturbance.  Respiratory: Negative for cough, choking, shortness of breath and wheezing.   Cardiovascular: Positive for leg swelling. Negative for chest pain and palpitations.       History hypertension  Gastrointestinal: Negative for nausea, abdominal pain, diarrhea, constipation and abdominal distention.  Endocrine: Negative for cold intolerance, heat intolerance, polydipsia, polyphagia and polyuria.       Diabetes mellitus  Genitourinary: Negative for dysuria, urgency, frequency, hematuria, flank pain, vaginal discharge, difficulty urinating and pelvic pain.  Musculoskeletal: Positive for gait problem. Negative for myalgias, back pain, arthralgias, neck pain and neck stiffness.  Skin: Negative.  Negative for color change, pallor and rash.  Allergic/Immunologic: Negative.   Neurological: Negative for dizziness, tremors, seizures, syncope, weakness, numbness and headaches.       Hx CVA and left hemiparesis.  Hematological: Negative for adenopathy. Does not bruise/bleed easily.       History of anemia, iron deficiency  Psychiatric/Behavioral: Negative for suicidal ideas, hallucinations, behavioral problems, confusion, sleep disturbance, dysphoric mood and agitation. The patient is not nervous/anxious and is not hyperactive.     Filed Vitals:   06/20/15 1015  BP: 112/54  Pulse: 63  Temp: 98 F (36.7 C)  Resp: 22   Wt Readings from Last 3 Encounters:  01/25/14 238 lb (107.956 kg)  11/16/13 232 lb (105.235 kg)  10/19/13 232 lb (105.235 kg)    There is no weight on file to calculate BMI.  Physical Exam  Constitutional: She appears well-developed and well-nourished.  HENT:  Head: Normocephalic and atraumatic.  Right Ear: External ear normal.  Left Ear: External ear normal.  Mouth/Throat:  Oropharynx is clear and moist. No oropharyngeal exudate.  Eyes: Conjunctivae and EOM are normal. Pupils are equal, round, and reactive to light.  Neck: Normal range of motion. Neck supple. No thyromegaly present.  Cardiovascular: Normal rate, regular rhythm and normal heart sounds.   Pulmonary/Chest: Effort normal and breath sounds normal. No respiratory distress.  Abdominal: Soft. Bowel sounds are normal. She exhibits no distension.  Musculoskeletal: Normal range of motion. She exhibits edema. She exhibits no tenderness.  In [facility]; left sided hemiplegia   Lymphadenopathy:    She has no cervical adenopathy.  Neurological: She is alert.  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 She is not diaphoretic. No erythema.  Psychiatric: She has a normal mood and affect.     Labs reviewed: No flowsheet data found. Lab Results  Component Value Date   TSH 0.115 *Test methodology is 3rd generation TSH* 02/24/2009   Lab Results  Component Value Date   BUN 5* 03/14/2009   BUN 7 02/25/2009   BUN 12 02/24/2009   Lab Results  Component Value Date   CREATININE 1.18 03/14/2009   CREATININE 1.32* 02/25/2009   CREATININE 1.42* 02/24/2009   Lab Results  Component Value Date   HGBA1C 6.1 05/11/2015   HGBA1C 7.2* 08/26/2013   HGBA1C 6.5 12/31/2008       Assessment/Plan  1. Edema, unspecified type Discontinued amlodipine. Increase furosemide from 40 mg daily to 80 mg daily Baclofen discontinued  2. Chronic diastolic congestive heart failure (HCC) Controlled  3. Essential hypertension Controlled  4. DM (diabetes mellitus), type 2 with neurological complications (HCC) -Last A1c 6.2 -CMP and urine microalbumin ordered  5. Type 2 DM with CKD stage 3 and hypertension (HCC) -CMP

## 2015-06-22 LAB — MICROALBUMIN, URINE: Microalb, Ur: 10.4

## 2015-06-27 ENCOUNTER — Encounter: Payer: Self-pay | Admitting: Internal Medicine

## 2015-06-27 ENCOUNTER — Non-Acute Institutional Stay (SKILLED_NURSING_FACILITY): Payer: Medicare Other | Admitting: Internal Medicine

## 2015-06-27 DIAGNOSIS — I10 Essential (primary) hypertension: Secondary | ICD-10-CM | POA: Diagnosis not present

## 2015-06-27 DIAGNOSIS — R609 Edema, unspecified: Secondary | ICD-10-CM | POA: Diagnosis not present

## 2015-06-27 DIAGNOSIS — E1149 Type 2 diabetes mellitus with other diabetic neurological complication: Secondary | ICD-10-CM | POA: Diagnosis not present

## 2015-06-27 DIAGNOSIS — D509 Iron deficiency anemia, unspecified: Secondary | ICD-10-CM | POA: Diagnosis not present

## 2015-06-27 NOTE — Progress Notes (Signed)
[facility] Room Number: 211-A  Place of Service: [facility] (31)     Allergies  Allergen Reactions  . Lotensin [Benazepril Hcl] Other (See Comments)    Unknown reaction    Chief Complaint  Patient presents with  . Acute Visit    HPI:  Patient was seen for acute follow-up of edema of the legs. She was last seen 06/20/2015. At that time I discontinued amlodipine and increase furosemide from 40 mg daily to 80 mg daily. We also discontinued her baclofen because she said it wasn't helping much.  Since last seen the edema has improved. She is tolerating the change in medications well.  Medications: Patient's Medications  New Prescriptions   No medications on file  Previous Medications   ACETAMINOPHEN (TYLENOL) 325 MG TABLET    Take 650 mg by mouth daily. DO NOT EXCEED 4 GMS OF TYLENOL IN 24 HOURS   ASPIRIN EC 81 MG TABLET    Take 81 mg by mouth daily.   ATORVASTATIN (LIPITOR) 40 MG TABLET    Take 40 mg by mouth daily.    BENZONATATE (TESSALON) 100 MG CAPSULE    Take 100 mg by mouth every 8 (eight) hours as needed for cough.   BETHANECHOL (URECHOLINE) 10 MG TABLET    Take 10 mg by mouth 3 (three) times daily.    CARVEDILOL (COREG) 25 MG TABLET    Take 25 mg by mouth 2 (two) times daily with a meal. For CAD   DIVALPROEX (DEPAKOTE SPRINKLE) 125 MG CAPSULE    Take 250 mg by mouth at bedtime.   ERGOCALCIFEROL (VITAMIN D2) 50000 UNITS CAPSULE    Take 1 capsule (50,000 Units total) by mouth once a week. On the 5th of every month   ESOMEPRAZOLE (NEXIUM) 40 MG CAPSULE    Take 40 mg by mouth daily at 12 noon. For GERD   FUROSEMIDE (LASIX) 80 MG TABLET    1 each morning to control edema   GUAIFENESIN (ROBITUSSIN) 100 MG/5ML LIQUID    Take 15 mLs by mouth every 4 (four) hours as needed for cough.   INSULIN GLARGINE (LANTUS) 100 UNIT/ML INJECTION    Inject 22 Units into the skin at bedtime. DO NOT MIX WITH OTHER INSULINS   IPRATROPIUM-ALBUTEROL (DUONEB)  0.5-2.5 (3) MG/3ML SOLN    Take 3 mLs by nebulization every 6 (six) hours as needed.   LINAGLIPTIN (TRADJENTA) 5 MG TABS TABLET    Take 5 mg by mouth daily.   LOSARTAN (COZAAR) 50 MG TABLET    Take 50 mg by mouth daily. For Hypertension   METHYLPHENIDATE (RITALIN) 5 MG TABLET    Take one tablet by mouth every morning before breakfast for depressive disorder   NUTRITIONAL SUPPLEMENTS (BOOST PO)    Take 237 mLs by mouth every evening.   OLOPATADINE HCL (PATADAY) 0.2 % SOLN    Place 1 drop into both eyes daily.    POLYETHYL GLYCOL-PROPYL GLYCOL (SYSTANE ULTRA OP)    Apply 1 drop to eye 3 (three) times daily. Both , wait 3-5 minutes between eye medications   PROMETHAZINE (PHENERGAN) 25 MG TABLET    Take 25 mg by mouth every 4 (four) hours as needed for nausea or vomiting (For nausea nad vomiting).   VENLAFAXINE (EFFEXOR) 75 MG TABLET    Take 75 mg by mouth daily.  Modified Medications   No medications on file  Discontinued Medications   VENLAFAXINE (EFFEXOR) 37.5 MG TABLET  Take 37.5 mg by mouth daily.     Review of Systems  Constitutional: Negative for fever, chills, diaphoresis, activity change, appetite change, fatigue and unexpected weight change.       Morbid obesity  HENT: Negative for congestion, ear discharge, ear pain, hearing loss, postnasal drip, rhinorrhea, sore throat, tinnitus, trouble swallowing and voice change.   Eyes: Negative for pain, redness, itching and visual disturbance.  Respiratory: Negative for cough, choking, shortness of breath and wheezing.   Cardiovascular: Positive for leg swelling. Negative for chest pain and palpitations.       History hypertension  Gastrointestinal: Negative for nausea, abdominal pain, diarrhea, constipation and abdominal distention.  Endocrine: Negative for cold intolerance, heat intolerance, polydipsia, polyphagia and polyuria.       Diabetes mellitus  Genitourinary: Negative for dysuria, urgency, frequency, hematuria, flank pain, vaginal  discharge, difficulty urinating and pelvic pain.  Musculoskeletal: Positive for gait problem. Negative for myalgias, back pain, arthralgias, neck pain and neck stiffness.  Skin: Negative.  Negative for color change, pallor and rash.  Allergic/Immunologic: Negative.   Neurological: Negative for dizziness, tremors, seizures, syncope, weakness, numbness and headaches.       Hx CVA and left hemiparesis.  Hematological: Negative for adenopathy. Does not bruise/bleed easily.       History of anemia, iron deficiency  Psychiatric/Behavioral: Negative for suicidal ideas, hallucinations, behavioral problems, confusion, sleep disturbance, dysphoric mood and agitation. The patient is not nervous/anxious and is not hyperactive.     Filed Vitals:   06/27/15 1233  BP: 132/76  Pulse: 64  Temp: 98.7 F (37.1 C)  Resp: 19   Wt Readings from Last 3 Encounters:  01/25/14 238 lb (107.956 kg)  11/16/13 232 lb (105.235 kg)  10/19/13 232 lb (105.235 kg)    There is no weight on file to calculate BMI.  Physical Exam  Constitutional: She appears well-developed and well-nourished.  HENT:  Head: Normocephalic and atraumatic.  Right Ear: External ear normal.  Left Ear: External ear normal.  Mouth/Throat: Oropharynx is clear and moist. No oropharyngeal exudate.  Eyes: Conjunctivae and EOM are normal. Pupils are equal, round, and reactive to light.  Neck: Normal range of motion. Neck supple. No thyromegaly present.  Cardiovascular: Normal rate, regular rhythm and normal heart sounds.   Pulmonary/Chest: Effort normal and breath sounds normal. No respiratory distress.  Abdominal: Soft. Bowel sounds are normal. She exhibits no distension.  Musculoskeletal: Normal range of motion. She exhibits edema. She exhibits no tenderness.  In [facility]; left sided hemiplegia   Lymphadenopathy:    She has no cervical adenopathy.  Neurological: She is alert.  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 She  is not diaphoretic. No erythema.  Psychiatric: She has a normal mood and affect.     Labs reviewed: No flowsheet data found. Lab Results  Component Value Date   TSH 0.115 **Test methodology is 3rd generation TSH** 02/24/2009   Lab Results  Component Value Date   BUN 5* 03/14/2009   BUN 7 02/25/2009   BUN 12 02/24/2009   Lab Results  Component Value Date   CREATININE 1.18 03/14/2009   CREATININE 1.32* 02/25/2009   CREATININE 1.42* 02/24/2009   Lab Results  Component Value Date   HGBA1C 6.1 05/11/2015   HGBA1C 7.2* 08/26/2013   HGBA1C 6.5 12/31/2008    06/24/15 CBC w/Diff & PLT White Blood Cell (WBC) 10.6 k/uL 3.8-10.8 Final Red Blood Cell (RBC) 4.0 M/uL 3.5-5.5 Final Hemoglobin (HGB) 11.5  g/dL 11.4-16.0 Final Hematocrit (HCT) 36.6 % 33.0-48.0 Final Mean Corpuscular Volume (MCV) 91.7 fL 79.0-100.0 Final Mean Corpuscular HGB (MCH) 28.8 pg 26.8-33.2 Final Mean Corpuscular HGB Conc (MCHC) 31.4 g/dL 30.5-36.0 Final RBC Dist Width (RDW) 13.6 % 9.0-15.0 Final Platelet 252 k/uL 150-450 Final Neutrophils% 63.7 % 50.0-80.0 Final Lymphocytes% 25.0 % 21.0-51.0 Final Monocytes% 6.1 % 2.0-12.0 Final Eosinophils% 4.2 % 0.0-5.0 Final Basophils% 0.5 % 0.0-2.0 Final Neutrophils# 6.8 k/uL 1.5-6.6 H Final Lymphocytes # 2.7 k/uL 1.5-3.5 Final Monocytes # 0.7 k/uL 0.1-1.0 Final Eosinophils# 0.4 k/uL 0.0-0.6 Final Basophils# 0.1 k/uL 0.0-0.1 Final  Comprehensive Metabolic Panel Sodium 123456 mmol/L 135-146 Final Potassium 4.0 mmol/L 3.5-5.3 Final Chloride 105 mmol/L 95-109 Final CO2 30.9 mmol/L 21.0-31.0 Final Anion Gap 10 meq/L 5-21 Final Glucose 105 mg/dL 70-105 Final Urea Nitrogen (BUN) 25 mg/dL 5-25 Final Creatinine, Serum 1.80 mg/dL 0.60-1.30 H Final BUN/Creatinine Ratio 13.9 Ratio 6.0-25.0 Final Calcium 9.3 mg/dL 8.2-10.5 Final Calcium 9.3 mg/dL 8.2-10.5 Final Protein, Total 6.5 g/dL 6.0-8.3 Final Albumin 3.69 g/dL 3.50-5.50 Final Globulin 2.8 g/dL 1.5-4.5  Final Albumin/Globulin Ratio 1.3 Ratio 0.8-2.5 Final Bilirubin, Total 0.36 mg/dL 0.00-1.40 Final Alkaline Phosphatase 109 U/L 41-130 Final AST (SGOT) 14 U/L 0-39 Final ALT (SGPT) 13 U/L 0-45 Final Glomerular Filtration Rate 29.65 mls/min/1.73 m2 >60.00 L Final Glomerular Filtration Rate - African American 35.88 mls/min/1.73 m2 >60.00 L Final  Lipid Panel Cholesterol, Total 135 mg/dL <200 Final High Density Lipid (HDL) 30.5 mg/dL >40.0 L Final Low Density Lipid (LDL) 79 mg/dL <100 Final Very Low Density Lipid (VLDL) 25.2 mg/dL <30.0 Final Triglycerides 126 mg/dL <150 Final Cholesterol/HDL Ratio 4.4 % 0.0-5.0 Final  TSH, 3rd Generation 0.876 uIU/mL 0.300-5.000 Final    Assessment/Plan  1. Edema, unspecified type Improved with recent medication changes  2. Anemia, iron deficiency Resolved. Last hemoglobin 11.5 gm %   3. DM (diabetes mellitus), type 2 with neurological complications ([place]) Under good control on Lantus and Tradjenta. Consider adjusting Lantus and discontinuation of Tradjenta at future visit.  4. Essential hypertension Controlled on current medication

## 2015-07-04 ENCOUNTER — Other Ambulatory Visit: Payer: Self-pay | Admitting: *Deleted

## 2015-07-04 MED ORDER — METHYLPHENIDATE HCL 5 MG PO TABS: ORAL_TABLET | ORAL | Status: DC

## 2015-07-04 NOTE — Telephone Encounter (Signed)
[facility] 

## 2015-08-30 ENCOUNTER — Other Ambulatory Visit: Payer: Self-pay | Admitting: *Deleted

## 2015-08-30 MED ORDER — METHYLPHENIDATE HCL 5 MG PO TABS: ORAL_TABLET | ORAL | Status: DC

## 2015-08-30 NOTE — Telephone Encounter (Signed)
[facility] 

## 2015-10-04 ENCOUNTER — Other Ambulatory Visit: Payer: Self-pay | Admitting: *Deleted

## 2015-10-04 MED ORDER — METHYLPHENIDATE HCL 5 MG PO TABS: ORAL_TABLET | ORAL | Status: DC

## 2015-10-04 NOTE — Telephone Encounter (Signed)
[facility] 

## 2015-10-10 ENCOUNTER — Non-Acute Institutional Stay (SKILLED_NURSING_FACILITY): Payer: Medicare Other | Admitting: Internal Medicine

## 2015-10-10 ENCOUNTER — Encounter: Payer: Self-pay | Admitting: Internal Medicine

## 2015-10-10 DIAGNOSIS — E785 Hyperlipidemia, unspecified: Secondary | ICD-10-CM

## 2015-10-10 DIAGNOSIS — R609 Edema, unspecified: Secondary | ICD-10-CM

## 2015-10-10 DIAGNOSIS — F329 Major depressive disorder, single episode, unspecified: Secondary | ICD-10-CM | POA: Insufficient documentation

## 2015-10-10 DIAGNOSIS — E1149 Type 2 diabetes mellitus with other diabetic neurological complication: Secondary | ICD-10-CM | POA: Diagnosis not present

## 2015-10-10 DIAGNOSIS — I5032 Chronic diastolic (congestive) heart failure: Secondary | ICD-10-CM

## 2015-10-10 DIAGNOSIS — R110 Nausea: Secondary | ICD-10-CM | POA: Diagnosis not present

## 2015-10-10 DIAGNOSIS — F3341 Major depressive disorder, recurrent, in partial remission: Secondary | ICD-10-CM | POA: Diagnosis not present

## 2015-10-10 DIAGNOSIS — I10 Essential (primary) hypertension: Secondary | ICD-10-CM | POA: Diagnosis not present

## 2015-10-10 NOTE — Progress Notes (Signed)
Patient ID: [name], female   DOB: Feb 12, 1946, 70 y.o.   MRN: [id number]  Location:  [place] Room Number: [id number] Place of Service:  SNF (31) Provider: Estill Dooms, MD  Patient Care Team: Estill Dooms, MD as PCP - General (Internal Medicine)  Extended Emergency Contact Information Primary Emergency Contact: [facility] Address: [address]          [place],  [place] Home Phone: [phone] Relation: None Secondary Emergency Contact: PATIENT,REFUSED TO G Address: WARNING DO NOT UPDATE          DO NOT UPDATE THIS [address] Home Phone: [phone] Relation: None  Code Status:  full Goals of care: Advanced Directive information Advanced Directives 10/10/2015  Does patient have an advance directive? No  Would patient like information on creating an advanced directive? -     Chief Complaint  Patient presents with  . Acute Visit    patient refusing to take her medications    HPI:  Pt is a 70 y.o. female seen today for an acute visit for refusal of medications. Staff denies me that the second interim, patient medications. Patient tells me that this is because they have created nausea. Patient recently had an increasing her Effexor from 75 mg for depression.  After I reviewed the list of medications, there appear to be many medications that due to the patient's age and physical state, we can try to do without. Also she is on both Lantus and Tradjenta, so we could probably do without the Tradjenta simply use Lantus. Patient is willing to take the remaining medications.   Past Medical History  Diagnosis Date  . HYPERTENSION 02/14/2006    Qualifier: Diagnosis of  By: Oretha Ellis    . PE 12/31/2008    Qualifier: Diagnosis of  By: Elie Confer PharmD, Ulice Dash    . MITRAL REGURGITATION 05/23/2006    Annotation: S/P valve repair Qualifier: Diagnosis of  By: Oretha Ellis    . CARDIOMYOPATHY 05/23/2006    Annotation: Likely 2/2 EtOH, DM and  HTN Qualifier: Diagnosis of  By: Oretha Ellis    . CONGESTIVE HEART FAILURE 02/14/2006    Qualifier: Diagnosis of  By: Oretha Ellis    . DVT 12/31/2008    Qualifier: Diagnosis of  By: Caryn Section MD, Adwait    . Type II or unspecified type diabetes mellitus with neurological manifestations, not stated as uncontrolled 02/23/2013  . PERIPHERAL NEUROPATHY 11/18/2006    Qualifier: Diagnosis of  By: Hilma Favors  DO, Beth    . DECUBITUS ULCER, BUTTOCK 02/14/2009    Qualifier: Diagnosis of  By: Cathren Laine MD, Ankit    . RENAL INSUFFICIENCY 05/23/2006    Qualifier: Diagnosis of  By: Oretha Ellis    . HYPOKALEMIA 11/26/2006    Qualifier: Diagnosis of  By: Hilma Favors  DO, Beth    . PACEMAKER, PERMANENT 05/23/2006    Qualifier: Diagnosis of  By: Oretha Ellis    . GERD (gastroesophageal reflux disease)   . Hyperlipidemia   . Diverticular disease   . Depression with anxiety   . Anemia   . Lower extremity edema     chronic  . CKD (chronic kidney disease)   . Neuropathy ([place])   . Diabetes mellitus, type II ([place])   . Stroke [facility]) 2010    Resultant hemiplegia & dysphagia  . Hemiplegia ([place])   . Dysphagia   . Breast mass   . Ovarian cancer ([place])   .  Cardiac pacemaker in situ   . MI (myocardial infarction) ([place])   . Colon polyp     hyperplastic and adenomatous  . Edema 06/20/2015  . Anemia, iron deficiency 06/20/2015   Past Surgical History  Procedure Laterality Date  . Pacemaker insertion    . Cataract extraction Bilateral   . Coronary artery bypass graft      Allergies  Allergen Reactions  . Lotensin [Benazepril Hcl] Other (See Comments)    Unknown reaction      Medication List       This list is accurate as of: 10/10/15 12:07 PM.  Always use your most recent med list.               acetaminophen 325 MG tablet  Commonly known as:  TYLENOL  Take 650 mg by mouth daily. DO NOT EXCEED 4 GMS OF TYLENOL IN 24 HOURS     aspirin EC 81 MG tablet  Take 81 mg by mouth daily.       atorvastatin 40 MG tablet  Commonly known as:  LIPITOR  Take 40 mg by mouth daily.     benzonatate 100 MG capsule  Commonly known as:  TESSALON  Take 100 mg by mouth every 8 (eight) hours as needed for cough.     bethanechol 10 MG tablet  Commonly known as:  URECHOLINE  Take 10 mg by mouth 3 (three) times daily.     BOOST PO  Take 237 mLs by mouth every evening.     carvedilol 25 MG tablet  Commonly known as:  COREG  Take 25 mg by mouth 2 (two) times daily with a meal. For CAD     divalproex 125 MG capsule  Commonly known as:  DEPAKOTE SPRINKLE  Take 250 mg by mouth at bedtime.     ergocalciferol 50000 units capsule  Commonly known as:  VITAMIN D2  Take 1 capsule (50,000 Units total) by mouth once a week. On the 5th of every month     esomeprazole 40 MG capsule  Commonly known as:  NEXIUM  Take 40 mg by mouth daily at 12 noon. For GERD     furosemide 80 MG tablet  Commonly known as:  LASIX  1 each morning to control edema     guaiFENesin 100 MG/5ML liquid  Commonly known as:  ROBITUSSIN  Take 15 mLs by mouth every 4 (four) hours as needed for cough.     insulin glargine 100 UNIT/ML injection  Commonly known as:  LANTUS  Inject 22 Units into the skin at bedtime. DO NOT MIX WITH OTHER INSULINS     ipratropium-albuterol 0.5-2.5 (3) MG/3ML Soln  Commonly known as:  DUONEB  Take 3 mLs by nebulization every 6 (six) hours as needed.     linagliptin 5 MG Tabs tablet  Commonly known as:  TRADJENTA  Take 5 mg by mouth daily.     losartan 50 MG tablet  Commonly known as:  COZAAR  Take 50 mg by mouth daily. For Hypertension     methylphenidate 5 MG tablet  Commonly known as:  RITALIN  Take one tablet by mouth every morning before breakfast for depressive disorder     SYSTANE ULTRA OP  Apply 1 drop to eye 3 (three) times daily. Both , wait 3-5 minutes between eye medications     venlafaxine XR 150 MG 24 hr capsule  Commonly known as:  EFFEXOR-XR  Take 150 mg  by mouth. Take one tablet daily  for depression        Review of Systems  Constitutional: Negative for fever, chills, diaphoresis, activity change, appetite change, fatigue and unexpected weight change.       Morbid obesity  HENT: Negative for congestion, ear discharge, ear pain, hearing loss, postnasal drip, rhinorrhea, sore throat, tinnitus, trouble swallowing and voice change.   Eyes: Negative for pain, redness, itching and visual disturbance.  Respiratory: Negative for cough, choking, shortness of breath and wheezing.   Cardiovascular: Positive for leg swelling. Negative for chest pain and palpitations.       History hypertension  Gastrointestinal: Negative for nausea, abdominal pain, diarrhea, constipation and abdominal distention.  Endocrine: Negative for cold intolerance, heat intolerance, polydipsia, polyphagia and polyuria.       Diabetes mellitus  Genitourinary: Negative for dysuria, urgency, frequency, hematuria, flank pain, vaginal discharge, difficulty urinating and pelvic pain.  Musculoskeletal: Positive for gait problem. Negative for myalgias, back pain, arthralgias, neck pain and neck stiffness.  Skin: Negative.  Negative for color change, pallor and rash.  Allergic/Immunologic: Negative.   Neurological: Negative for dizziness, tremors, seizures, syncope, weakness, numbness and headaches.       Hx CVA and left hemiparesis.  Hematological: Negative for adenopathy. Does not bruise/bleed easily.       History of anemia, iron deficiency  Psychiatric/Behavioral: Negative for suicidal ideas, hallucinations, behavioral problems, confusion, sleep disturbance, dysphoric mood and agitation. The patient is not nervous/anxious and is not hyperactive.     Immunization History  Administered Date(s) Administered  . Influenza Whole 02/14/2009  . Influenza-Unspecified 02/26/2013, 02/11/2015  . Pneumococcal-Unspecified 01/31/2010   Pertinent  Health Maintenance Due  Topic Date Due  .  FOOT EXAM  07/03/1956  . DEXA SCAN  07/04/2011  . PNA vac Low Risk Adult (1 of 2 - PCV13) 07/04/2011  . LIPID PANEL  07/30/2014  . MAMMOGRAM  11/06/2014  . OPHTHALMOLOGY EXAM  11/25/2014  . HEMOGLOBIN A1C  08/09/2015  . INFLUENZA VACCINE  12/06/2015  . COLONOSCOPY  05/20/2018     Filed Vitals:   10/10/15 1156  BP: 108/53  Pulse: 64  Temp: 98.6 F (37 C)  Resp: 19  Height: 5\' 6"  (1.676 m)  Weight: 228 lb (103.42 kg)  SpO2: 95%   Body mass index is 36.82 kg/(m^2). Physical Exam  Constitutional: She appears well-developed and well-nourished.  HENT:  Head: Normocephalic and atraumatic.  Right Ear: External ear normal.  Left Ear: External ear normal.  Mouth/Throat: Oropharynx is clear and moist. No oropharyngeal exudate.  Eyes: Conjunctivae and EOM are normal. Pupils are equal, round, and reactive to light.  Neck: Normal range of motion. Neck supple. No thyromegaly present.  Cardiovascular: Normal rate, regular rhythm and normal heart sounds.   Pulmonary/Chest: Effort normal and breath sounds normal. No respiratory distress.  Abdominal: Soft. Bowel sounds are normal. She exhibits no distension.  Musculoskeletal: Normal range of motion. She exhibits edema. She exhibits no tenderness.  In [facility]; left sided hemiplegia   Lymphadenopathy:    She has no cervical adenopathy.  Neurological: She is alert.  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 She is not diaphoretic. No erythema.  Psychiatric: She has a normal mood and affect.    Labs reviewed: No results for input(s): NA, K, CL, CO2, GLUCOSE, BUN, CREATININE, CALCIUM, MG, PHOS in the last 8760 hours. No results for input(s): AST, ALT, ALKPHOS, BILITOT, PROT, ALBUMIN in the last 8760 hours. No results for input(s): WBC, NEUTROABS, HGB, HCT,  MCV, PLT in the last 8760 hours. Lab Results  Component Value Date   TSH 0.115 **Test methodology is 3rd generation TSH** 02/24/2009   Lab Results  Component Value  Date   HGBA1C 6.1 05/11/2015   Lab Results  Component Value Date   CHOL 112 07/29/2013   HDL 39 07/29/2013   LDLCALC 63 07/29/2013   TRIG 49 07/29/2013   CHOLHDL 3.9 02/23/2009    Assessment/Plan  1. Nausea without vomiting Possibly related medications. Multiple drugs were discontinued.  2. Recurrent major depressive disorder, in partial remission (HCC) Discontinue Depakote, Ritalin, and Effexor  3. DM (diabetes mellitus), type 2 with neurological complications (HCC) Discontinue Tradjenta -Increase Lantus to 25 units at bedtime  4. Essential hypertension Continue Coreg 25 mg twice a day and losartan 50 mg daily  5. Dyslipidemia Discontinue Lipitor  6. Edema, unspecified type Continue furosemide 80 mg daily  7. Chronic diastolic congestive heart failure ([place]) Continue Coreg

## 2016-11-29 ENCOUNTER — Other Ambulatory Visit: Payer: Self-pay | Admitting: Internal Medicine

## 2016-11-29 DIAGNOSIS — Z1231 Encounter for screening mammogram for malignant neoplasm of breast: Secondary | ICD-10-CM

## 2016-12-12 ENCOUNTER — Ambulatory Visit
Admission: RE | Admit: 2016-12-12 | Discharge: 2016-12-12 | Disposition: A | Discharge status: Home / Self Care | Payer: Medicare Other | Source: Ambulatory Visit | Attending: Internal Medicine | Admitting: Internal Medicine

## 2016-12-12 DIAGNOSIS — Z1231 Encounter for screening mammogram for malignant neoplasm of breast: Secondary | ICD-10-CM

## 2017-05-23 ENCOUNTER — Encounter: Payer: Self-pay | Admitting: Neurology

## 2017-05-23 ENCOUNTER — Ambulatory Visit (INDEPENDENT_AMBULATORY_CARE_PROVIDER_SITE_OTHER): Payer: Medicare Other | Admitting: Neurology

## 2017-05-23 DIAGNOSIS — I69398 Other sequelae of cerebral infarction: Secondary | ICD-10-CM

## 2017-05-23 DIAGNOSIS — I69359 Hemiplegia and hemiparesis following cerebral infarction affecting unspecified side: Secondary | ICD-10-CM | POA: Insufficient documentation

## 2017-05-23 HISTORY — DX: Hemiplegia and hemiparesis following cerebral infarction affecting unspecified side: I69.359

## 2017-05-23 HISTORY — DX: Hemiplegia and hemiparesis following cerebral infarction affecting unspecified side: I69.398

## 2017-05-23 NOTE — Progress Notes (Signed)
Reason for visit: Flexion contracture left hand  Referring physician: Dr. Laqueta Due [name] is a 72 y.o. female  History of present illness:  [name] is a 72 year old right-handed black female with a history of cerebrovascular disease.  The patient sustained a stroke around 2010 that left her with a significant left hemiparesis and hemisensory deficit.  The patient has required an extended care facility, she currently is in [facility] and [facility].  The patient is nonambulatory.  She is wheelchair-bound.  She has developed flexion contractures at the elbow and with the fingers of the left hand.  The patient has had problems with the nails of the fingers growing and pushing into the palm of her hand.  They have difficulty washing her hand or protecting the palm from abrasion from the nails.  The patient is sent to this office for evaluation of possible Botox therapy for the finger contractures.  The patient also has a short-term memory deficit that started at the time of the stroke and has continued.  Past Medical History:  Diagnosis Date  . Anemia   . Anemia, iron deficiency 06/20/2015  . Breast mass   . Cardiac pacemaker in situ   . CARDIOMYOPATHY 05/23/2006   Annotation: Likely 2/2 EtOH, DM and HTN Qualifier: Diagnosis of  By: Oretha Ellis    . CKD (chronic kidney disease)   . Colon polyp    hyperplastic and adenomatous  . CONGESTIVE HEART FAILURE 02/14/2006   Qualifier: Diagnosis of  By: Oretha Ellis    . DECUBITUS ULCER, BUTTOCK 02/14/2009   Qualifier: Diagnosis of  By: Cathren Laine MD, Ankit    . Depression with anxiety   . Diabetes mellitus, type II ([place])   . Diverticular disease   . DVT 12/31/2008   Qualifier: Diagnosis of  By: Caryn Section MD, Adwait    . Dysphagia   . Edema 06/20/2015  . GERD (gastroesophageal reflux disease)   . Hemiplegia ([place])   . Hyperlipidemia   . HYPERTENSION 02/14/2006   Qualifier: Diagnosis of  By: Oretha Ellis     . HYPOKALEMIA 11/26/2006   Qualifier: Diagnosis of  By: Hilma Favors  DO, Beth    . Lower extremity edema    chronic  . MI (myocardial infarction) ([place])   . MITRAL REGURGITATION 05/23/2006   Annotation: S/P valve repair Qualifier: Diagnosis of  By: Oretha Ellis    . Neuropathy   . Ovarian cancer ([place])   . PACEMAKER, PERMANENT 05/23/2006   Qualifier: Diagnosis of  By: Oretha Ellis    . PE 12/31/2008   Qualifier: Diagnosis of  By: Elie Confer PharmD, Ulice Dash    . PERIPHERAL NEUROPATHY 11/18/2006   Qualifier: Diagnosis of  By: Hilma Favors  DO, Beth    . RENAL INSUFFICIENCY 05/23/2006   Qualifier: Diagnosis of  By: Oretha Ellis    . Stroke [facility]) 2010   Resultant hemiplegia & dysphagia  . Type II or unspecified type diabetes mellitus with neurological manifestations, not stated as uncontrolled(250.60) 02/23/2013    Past Surgical History:  Procedure Laterality Date  . CATARACT EXTRACTION Bilateral   . CORONARY ARTERY BYPASS GRAFT    . PACEMAKER INSERTION      Family History  Problem Relation Age of Onset  . Breast cancer Neg Hx     Social history:  reports that she has quit smoking. she has never used smokeless tobacco. She reports that she does not drink alcohol or use drugs.  Medications:  Prior to Admission medications   Medication Sig Start Date End Date Taking? Authorizing Provider  acetaminophen (TYLENOL) 325 MG tablet Take 650 mg by mouth every 6 (six) hours as needed.   Yes [provider]  atorvastatin (LIPITOR) 40 MG tablet Take 40 mg by mouth daily.   Yes [provider]  benzonatate (TESSALON) 100 MG capsule Take 100 mg by mouth 3 (three) times daily as needed for cough.   Yes [provider]  carvedilol (COREG) 25 MG tablet Take 25 mg by mouth 2 (two) times daily with a meal. For CAD   Yes [provider]  Cholecalciferol (VITAMIN D3 PO) Take 1,000 Units by mouth daily.   Yes [provider]  esomeprazole (NEXIUM) 40 MG  capsule Take 40 mg by mouth daily at 12 noon. For GERD   Yes [provider]  furosemide (LASIX) 20 MG tablet Take 60 mg by mouth daily.   Yes [provider]  gabapentin (NEURONTIN) 100 MG capsule Take 100 mg by mouth at bedtime.   Yes [provider]  insulin glargine (LANTUS) 100 UNIT/ML injection Inject 20 Units into the skin at bedtime. DO NOT MIX WITH OTHER INSULINS   Yes [provider]  ipratropium-albuterol (DUONEB) 0.5-2.5 (3) MG/3ML SOLN Take 3 mLs by nebulization every 6 (six) hours as needed.   Yes [provider]  losartan (COZAAR) 50 MG tablet Take 50 mg by mouth daily. For Hypertension   Yes [provider]  Multiple Vitamins-Minerals (CENTRUM ADULTS PO) Take 1 tablet by mouth daily.   Yes [provider]  Polyethyl Glycol-Propyl Glycol (SYSTANE ULTRA OP) Apply 1 drop to eye 3 (three) times daily. Both , wait 3-5 minutes between eye medications   Yes [provider]  venlafaxine (EFFEXOR) 37.5 MG tablet Take 37.5 mg by mouth daily.   Yes [provider]      Allergies  Allergen Reactions  . Lotensin [Benazepril Hcl] Other (See Comments)    Unknown reaction    ROS:  Out of a complete 14 system review of symptoms, the patient complains only of the following symptoms, and all other reviewed systems are negative.  Memory disorder Weakness, gait disorder  Blood pressure (!) 159/88, pulse 81.  Physical Exam  General: The patient is alert and cooperative at the time of the examination.  The patient is moderately obese.  Eyes: Pupils are equal, round, and reactive to light. Discs are flat bilaterally.  Neck: The neck is supple, no carotid bruits are noted.  Respiratory: The respiratory examination is clear.  Cardiovascular: The cardiovascular examination reveals a regular rate and rhythm, no obvious murmurs or rubs are noted.  Skin: Extremities are without significant edema.  Neurologic  Exam  Mental status: The patient is alert and oriented x 3 at the time of the examination. The patient is able to answer basic questions, she is cooperative.  Cranial nerves: Facial symmetry is present. There is good sensation of the face to pinprick and soft touch is noted on the right, decreased on the left. The strength of the facial muscles and the muscles to head turning and shoulder shrug are normal bilaterally. Speech is well enunciated, no aphasia or dysarthria is noted. Extraocular movements are full. Visual fields are full. The tongue is midline, and the patient has symmetric elevation of the soft palate. No obvious hearing deficits are noted.  Motor: The motor testing reveals 5 over 5 strength of the right extremities.  The  patient has minimal voluntary control involving the left arm and leg.  Sensory: Sensory testing is intact to pinprick, soft touch and vibration sensation of the right extremities.  Position sensation is decreased on the right foot, normal in the right arm.  The patient has decreased pinprick, soft touch, vibration sensation on the left arm and leg.  No evidence of extinction is noted.  Coordination: Cerebellar testing reveals good finger-nose-finger and heel-to-shin on the right, not able to perform on the left.  Gait and station: The patient is unable to ambulate, she is wheelchair-bound.  Reflexes: Deep tendon reflexes are elevated on the left biceps and left knee jerk reflexes.  Ankle jerk reflexes are decreased bilaterally.  Toes are neutral bilaterally.   Assessment/Plan:  1.  Cerebrovascular disease, left hemiparesis and hemisensory deficit  2.  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  The fingers of the hands are severely flexed and cannot be straightened.  The patient has flexion contractures with frozen joints involving the MP, DIP and PIP joints of all of the  fingers.  Botox therapy therefore is not likely to be helpful.  Padding the palm of the hand to prevent irritation from the fingernails is likely to help.  The patient will follow-up through this office if needed.  Jill Alexanders MD 05/23/2017 11:46 AM  [facility] [address] [place], [place] [address]  Phone [phone] Fax [phone]

## 2017-08-13 ENCOUNTER — Emergency Department (HOSPITAL_COMMUNITY)
Admission: EM | Admit: 2017-08-13 | Discharge: 2017-08-14 | Disposition: A | Discharge status: Home / Self Care | Payer: Medicare Other | Attending: Emergency Medicine | Admitting: Emergency Medicine

## 2017-08-13 ENCOUNTER — Emergency Department (HOSPITAL_COMMUNITY): Payer: Medicare Other

## 2017-08-13 ENCOUNTER — Encounter (HOSPITAL_COMMUNITY): Payer: Self-pay | Admitting: Emergency Medicine

## 2017-08-13 DIAGNOSIS — E1140 Type 2 diabetes mellitus with diabetic neuropathy, unspecified: Secondary | ICD-10-CM | POA: Diagnosis not present

## 2017-08-13 DIAGNOSIS — Z7982 Long term (current) use of aspirin: Secondary | ICD-10-CM | POA: Insufficient documentation

## 2017-08-13 DIAGNOSIS — Z794 Long term (current) use of insulin: Secondary | ICD-10-CM | POA: Insufficient documentation

## 2017-08-13 DIAGNOSIS — Z950 Presence of cardiac pacemaker: Secondary | ICD-10-CM | POA: Insufficient documentation

## 2017-08-13 DIAGNOSIS — I130 Hypertensive heart and chronic kidney disease with heart failure and stage 1 through stage 4 chronic kidney disease, or unspecified chronic kidney disease: Secondary | ICD-10-CM | POA: Diagnosis not present

## 2017-08-13 DIAGNOSIS — E1122 Type 2 diabetes mellitus with diabetic chronic kidney disease: Secondary | ICD-10-CM | POA: Diagnosis not present

## 2017-08-13 DIAGNOSIS — Z951 Presence of aortocoronary bypass graft: Secondary | ICD-10-CM | POA: Diagnosis not present

## 2017-08-13 DIAGNOSIS — I252 Old myocardial infarction: Secondary | ICD-10-CM | POA: Diagnosis not present

## 2017-08-13 DIAGNOSIS — Z79899 Other long term (current) drug therapy: Secondary | ICD-10-CM | POA: Insufficient documentation

## 2017-08-13 DIAGNOSIS — Z87891 Personal history of nicotine dependence: Secondary | ICD-10-CM | POA: Insufficient documentation

## 2017-08-13 DIAGNOSIS — I5032 Chronic diastolic (congestive) heart failure: Secondary | ICD-10-CM | POA: Diagnosis not present

## 2017-08-13 DIAGNOSIS — I509 Heart failure, unspecified: Secondary | ICD-10-CM

## 2017-08-13 DIAGNOSIS — I69359 Hemiplegia and hemiparesis following cerebral infarction affecting unspecified side: Secondary | ICD-10-CM | POA: Insufficient documentation

## 2017-08-13 DIAGNOSIS — R079 Chest pain, unspecified: Secondary | ICD-10-CM

## 2017-08-13 DIAGNOSIS — R0789 Other chest pain: Secondary | ICD-10-CM | POA: Diagnosis present

## 2017-08-13 DIAGNOSIS — N183 Chronic kidney disease, stage 3 (moderate): Secondary | ICD-10-CM | POA: Diagnosis not present

## 2017-08-13 LAB — CBC
HEMATOCRIT: 41.5 % (ref 36.0–46.0)
Hemoglobin: 12.9 g/dL (ref 12.0–15.0)
MCH: 27.8 pg (ref 26.0–34.0)
MCHC: 31.1 g/dL (ref 30.0–36.0)
MCV: 89.4 fL (ref 78.0–100.0)
PLATELETS: 236 10*3/uL (ref 150–400)
RBC: 4.64 MIL/uL (ref 3.87–5.11)
RDW: 13.9 % (ref 11.5–15.5)
WBC: 11.5 10*3/uL — AB (ref 4.0–10.5)

## 2017-08-13 LAB — BASIC METABOLIC PANEL
Anion gap: 9 (ref 5–15)
BUN: 9 mg/dL (ref 6–20)
CHLORIDE: 107 mmol/L (ref 101–111)
CO2: 25 mmol/L (ref 22–32)
CREATININE: 1.2 mg/dL — AB (ref 0.44–1.00)
Calcium: 8.7 mg/dL — ABNORMAL LOW (ref 8.9–10.3)
GFR calc Af Amer: 51 mL/min — ABNORMAL LOW (ref >60)
GFR calc non Af Amer: 44 mL/min — ABNORMAL LOW (ref >60)
Glucose, Bld: 196 mg/dL — ABNORMAL HIGH (ref 65–99)
POTASSIUM: 3.7 mmol/L (ref 3.5–5.1)
SODIUM: 141 mmol/L (ref 135–145)

## 2017-08-13 LAB — HEPATIC FUNCTION PANEL
ALBUMIN: 3 g/dL — AB (ref 3.5–5.0)
ALK PHOS: 82 U/L (ref 38–126)
ALT: 6 U/L — AB (ref 14–54)
AST: 13 U/L — ABNORMAL LOW (ref 15–41)
BILIRUBIN TOTAL: 0.5 mg/dL (ref 0.3–1.2)
Bilirubin, Direct: 0.1 mg/dL (ref 0.1–0.5)
Indirect Bilirubin: 0.4 mg/dL (ref 0.3–0.9)
TOTAL PROTEIN: 5.9 g/dL — AB (ref 6.5–8.1)

## 2017-08-13 LAB — BRAIN NATRIURETIC PEPTIDE: B Natriuretic Peptide: 639.1 pg/mL — ABNORMAL HIGH (ref 0.0–100.0)

## 2017-08-13 LAB — I-STAT TROPONIN, ED: Troponin i, poc: 0.07 ng/mL (ref 0.00–0.08)

## 2017-08-13 MED ORDER — FUROSEMIDE 10 MG/ML IJ SOLN
60.0000 mg | INTRAMUSCULAR | Status: AC
  Administered 2017-08-13: 60 mg via INTRAVENOUS
  Filled 2017-08-13: qty 6

## 2017-08-13 MED ORDER — IPRATROPIUM-ALBUTEROL 0.5-2.5 (3) MG/3ML IN SOLN
3.0000 mL | Freq: Once | RESPIRATORY_TRACT | Status: AC
  Administered 2017-08-13: 3 mL via RESPIRATORY_TRACT
  Filled 2017-08-13: qty 3

## 2017-08-13 MED ORDER — IOPAMIDOL (ISOVUE-370) INJECTION 76%
100.0000 mL | Freq: Once | INTRAVENOUS | Status: AC | PRN
  Administered 2017-08-14: 100 mL via INTRAVENOUS

## 2017-08-13 NOTE — ED Provider Notes (Signed)
[facility] EMERGENCY DEPARTMENT Provider Note   CSN: [id number] Arrival date & time: 08/13/17  2017     History   Chief Complaint Chief Complaint  Patient presents with  . Chest Pain    HPI [name] is a 72 y.o. female.  72yo M F w/ PMH including CHF, CKD, CVA w/ L sided hemiplegia, T2DM, pacemaker who p/w chest pain. Pt lives at [facility] and this morning around 7:30am she had a sudden onset of sharp, intermittent, central, non-radiating chest pain that began at rest. She states the pain is worse when she gets upset; the pressure/tightness get worse. She notes improvement with deep breathing. She has mild cough but no SOB or fevers. She was given ASA and NTG x 3 by EMS which improved her pain. Pain currently resolved.   The history is provided by the patient.  Chest Pain      Past Medical History:  Diagnosis Date  . Anemia   . Anemia, iron deficiency 06/20/2015  . Breast mass   . Cardiac pacemaker in situ   . CARDIOMYOPATHY 05/23/2006   Annotation: Likely 2/2 EtOH, DM and HTN Qualifier: Diagnosis of  By: Oretha Ellis    . CKD (chronic kidney disease)   . Colon polyp    hyperplastic and adenomatous  . CONGESTIVE HEART FAILURE 02/14/2006   Qualifier: Diagnosis of  By: Oretha Ellis    . DECUBITUS ULCER, BUTTOCK 02/14/2009   Qualifier: Diagnosis of  By: Cathren Laine MD, Ankit    . Depression with anxiety   . Diabetes mellitus, type II ([place])   . Diverticular disease   . DVT 12/31/2008   Qualifier: Diagnosis of  By: Caryn Section MD, Adwait    . Dysphagia   . Edema 06/20/2015  . GERD (gastroesophageal reflux disease)   . Hemiparesis and alteration of sensations as late effects of stroke ([place]) 05/23/2017  . Hemiplegia ([place])   . Hyperlipidemia   . HYPERTENSION 02/14/2006   Qualifier: Diagnosis of  By: Oretha Ellis    . HYPOKALEMIA 11/26/2006   Qualifier: Diagnosis of  By: Hilma Favors  DO, Beth    . Lower extremity edema    chronic  . MI  (myocardial infarction) ([place])   . MITRAL REGURGITATION 05/23/2006   Annotation: S/P valve repair Qualifier: Diagnosis of  By: Oretha Ellis    . Neuropathy   . Ovarian cancer ([place])   . PACEMAKER, PERMANENT 05/23/2006   Qualifier: Diagnosis of  By: Oretha Ellis    . PE 12/31/2008   Qualifier: Diagnosis of  By: Elie Confer PharmD, Ulice Dash    . PERIPHERAL NEUROPATHY 11/18/2006   Qualifier: Diagnosis of  By: Hilma Favors  DO, Beth    . RENAL INSUFFICIENCY 05/23/2006   Qualifier: Diagnosis of  By: Oretha Ellis    . Stroke [facility]) 2010   Resultant hemiplegia & dysphagia  . Type II or unspecified type diabetes mellitus with neurological manifestations, not stated as uncontrolled(250.60) 02/23/2013    Patient Active Problem List   Diagnosis Date Noted  . Hemiparesis and alteration of sensations as late effects of stroke ([place]) 05/23/2017  . Nausea without vomiting 10/10/2015  . Depression, major 10/10/2015  . Edema 06/20/2015  . Osteoarthrosis, unspecified whether generalized or localized, involving lower leg 10/19/2013  . DM (diabetes mellitus), type 2 with neurological complications ([place]) 37/90/2409  . Unspecified vitamin D deficiency 08/15/2012  . GERD (gastroesophageal reflux disease) 08/15/2012  . PE 12/31/2008  . DVT 12/31/2008  .  UTI 12/31/2008  . CVA WITH LEFT HEMIPARESIS 11/12/2008  . PERIPHERAL NEUROPATHY 11/18/2006  . Dyslipidemia 05/23/2006  . Anxiety state 05/23/2006  . MITRAL REGURGITATION 05/23/2006  . CARDIOMYOPATHY 05/23/2006  . CONSTIPATION NOS 05/23/2006  . Type 2 DM with CKD stage 3 and hypertension ([place]) 05/23/2006  . PACEMAKER, PERMANENT 05/23/2006  . Essential hypertension 02/14/2006  . Chronic diastolic congestive heart failure ([place]) 02/14/2006    Past Surgical History:  Procedure Laterality Date  . CATARACT EXTRACTION Bilateral   . CORONARY ARTERY BYPASS GRAFT    . PACEMAKER INSERTION       OB History   None      Home Medications    Prior to  Admission medications   Medication Sig Start Date End Date Taking? Authorizing Provider  acetaminophen (TYLENOL) 325 MG tablet Take 650 mg by mouth daily. @ 1300   Yes [provider]  aspirin EC 81 MG tablet Take 81 mg by mouth daily.   Yes [provider]  atorvastatin (LIPITOR) 40 MG tablet Take 40 mg by mouth daily at 6 PM.    Yes [provider]  benzonatate (TESSALON) 100 MG capsule Take 100 mg by mouth 3 (three) times daily as needed for cough.   Yes [provider]  carvedilol (COREG) 25 MG tablet Take 25 mg by mouth 2 (two) times daily with a meal. For CAD   Yes [provider]  Cholecalciferol (VITAMIN D3 PO) Take 1,000 Units by mouth daily.   Yes [provider]  Esomeprazole Magnesium 20 MG TBEC Take 20 mg by mouth daily at 12 noon. For GERD   Yes [provider]  furosemide (LASIX) 20 MG tablet Take 60 mg by mouth daily.   Yes [provider]  gabapentin (NEURONTIN) 100 MG capsule Take 100 mg by mouth at bedtime.   Yes [provider]  insulin glargine (LANTUS) 100 UNIT/ML injection Inject 15 Units into the skin at bedtime. DO NOT MIX WITH OTHER INSULINS   Yes [provider]  ipratropium-albuterol (DUONEB) 0.5-2.5 (3) MG/3ML SOLN Take 3 mLs by nebulization every 6 (six) hours as needed.   Yes [provider]  losartan (COZAAR) 50 MG tablet Take 50 mg by mouth daily. For Hypertension   Yes [provider]  memantine (NAMENDA) 5 MG tablet Take 5 mg by mouth daily. 08/02/17  Yes [provider]  Multiple Vitamins-Minerals (CENTRUM ADULTS PO) Take 1 tablet by mouth daily.   Yes [provider]  [facility] powder Apply 100,000 Units topically 2 (two) times daily. To affected area groin, under right arm folds and breast 05/27/17  Yes [provider]  Polyethyl Glycol-Propyl Glycol (SYSTANE ULTRA OP) Apply 1 drop to eye 3 (three) times daily. Both , wait 3-5 minutes  between eye medications   Yes [provider]  terbinafine (LAMISIL) 250 MG tablet Take 250 mg by mouth daily. 06/04/17  Yes [provider]    Family History Family History  Problem Relation Age of Onset  . Dementia Mother   . Heart attack Father   . Breast cancer Neg Hx     Social History Social History   Tobacco Use  . Smoking status: Former Research scientist (life sciences)  . Smokeless tobacco: Never Used  Substance Use Topics  . Alcohol use: No    Alcohol/week: 0.0 oz  . Drug use: No     Allergies   Lotensin [benazepril hcl]   Review of Systems Review of Systems  Cardiovascular: Positive  for chest pain.   All other systems reviewed and are negative except that which was mentioned in HPI   Physical Exam Updated Vital Signs BP 135/78   Pulse 89   Temp 98.9 F (37.2 C) (Oral)   Resp (!) 28   SpO2 99%   Physical Exam  Constitutional: She is oriented to person, place, and time. She appears well-developed and well-nourished. No distress.  HENT:  Head: Normocephalic and atraumatic.  Moist mucous membranes  Eyes: Conjunctivae are normal.  Neck: Neck supple.  Cardiovascular: Normal rate, regular rhythm and normal heart sounds.  No murmur heard. Pulmonary/Chest:  Mildly dyspneic, diminished B/l  Abdominal: Soft. Bowel sounds are normal. She exhibits no distension. There is no tenderness.  Musculoskeletal:       Right lower leg: She exhibits edema.       Left lower leg: She exhibits edema.  Neurological: She is alert and oriented to person, place, and time.  Fluent speech L sided hemiplegia  Skin: Skin is warm and dry.  Psychiatric: She has a normal mood and affect. Judgment normal.  Nursing note and vitals reviewed.    ED Treatments / Results  Labs (all labs ordered are listed, but only abnormal results are displayed) Labs Reviewed  BASIC METABOLIC PANEL - Abnormal; Notable for the following components:      Result Value   Glucose, Bld 196 (*)     Creatinine, Ser 1.20 (*)    Calcium 8.7 (*)    GFR calc non Af Amer 44 (*)    GFR calc Af Amer 51 (*)    All other components within normal limits  CBC - Abnormal; Notable for the following components:   WBC 11.5 (*)    All other components within normal limits  HEPATIC FUNCTION PANEL - Abnormal; Notable for the following components:   Total Protein 5.9 (*)    Albumin 3.0 (*)    AST 13 (*)    ALT 6 (*)    All other components within normal limits  BRAIN NATRIURETIC PEPTIDE - Abnormal; Notable for the following components:   B Natriuretic Peptide 639.1 (*)    All other components within normal limits  I-STAT TROPONIN, ED    EKG EKG Interpretation  Date/Time:  Tuesday August 13 2017 20:37:27 EDT Ventricular Rate:  98 PR Interval:    QRS Duration: 156 QT Interval:  431 QTC Calculation: 551 R Axis:   -91 Text Interpretation:  Sinus rhythm Nonspecific IVCD with LAD Anterior infarct, acute (LAD) Lateral leads are also involved Baseline wander in lead(s) II III aVF LBBB new from previous tracing in 2010 Confirmed by Theotis Burrow [phone]) on 08/13/2017 10:12:44 PM   Radiology Dg Chest 2 View  Result Date: 08/13/2017 CLINICAL DATA:  Central chest pain for 1 day. EXAM: CHEST - 2 VIEW COMPARISON:  02/22/2009 FINDINGS: Post median sternotomy. Epicardial leads with abandoned pacer wires in the left chest, unchanged. Mild cardiomegaly with increase from prior exam. Prominent central interstitial markings and peribronchial thickening. Possible trace pleural effusions. No confluent airspace disease or pneumothorax. The bones are under mineralized. IMPRESSION: Mild cardiomegaly. Prominent central interstitial markings and peribronchial thickening suspicious for mild pulmonary edema, with possible trace pleural effusions. Electronically Signed   By: Jeb Levering M.D.   On: 08/13/2017 21:34    Procedures Procedures (including critical care time)  Medications Ordered in ED Medications    iopamidol (ISOVUE-370) 76 % injection 100 mL (has no administration in time range)  ipratropium-albuterol (DUONEB) 0.5-2.5 (  3) MG/3ML nebulizer solution 3 mL (3 mLs Nebulization Given 08/13/17 2132)  furosemide (LASIX) injection 60 mg (60 mg Intravenous Given 08/13/17 2322)     Initial Impression / Assessment and Plan / ED Course  I have reviewed the triage vital signs and the nursing notes.  Pertinent labs & imaging results that were available during my care of the patient were reviewed by me and considered in my medical decision making (see chart for details).    PT well appearing on exam, EKG shows LBBB but without STEMI. VS stable.  Lab work shows negative troponin, creatinine 1.2, glucose 196, BNP 639, WBC 11.5.  Chest x-ray shows cardiomegaly and likely mild pulmonary edema with trace bilateral pleural effusions. These findings may be due to CHF exacerbation, however patient has h/o PE and is not currently anticoagulated, therefore I have ordered CTA chest to r/o PE. I am signing out to Dr. Dina Rich, who will f/u on imaging.   Final Clinical Impressions(s) / ED Diagnoses   Final diagnoses:  None    ED Discharge Orders    None       Kadeen Sroka, Wenda Overland, MD 08/13/17 2351

## 2017-08-13 NOTE — ED Notes (Signed)
RN called to CT, needs 20 G in [facility], RN unsuccessful.  Pt brought back to room, IV team ordered.

## 2017-08-13 NOTE — ED Triage Notes (Signed)
Per EMS. Pt from [facility], called for mid-st sudden onset non-radiating chest pain since 7:30 this morning. The Facilty gave her 324 ASA and 3 nitro right before EMS arrived.  EMS gave one more which decreased her pain from 7/10 to no pain.  She is having no difficulty breathing, no no other complaints.

## 2017-08-14 ENCOUNTER — Emergency Department (HOSPITAL_COMMUNITY): Payer: Medicare Other

## 2017-08-14 LAB — I-STAT TROPONIN, ED: TROPONIN I, POC: 0.06 ng/mL (ref 0.00–0.08)

## 2017-08-14 NOTE — Discharge Instructions (Addendum)
You were seen today for chest pain.  Your work-up is consistent with heart failure.  Take one additional dose of 60 mg of lasix tomorrow.  Follow-up closely with your cardiologist.

## 2017-08-14 NOTE — ED Provider Notes (Signed)
Patient signed out pending CT scan and repeat troponin.  CT scan without evidence of pulmonary embolus.  Clinically she appears volume overloaded and her symptoms are likely related to heart failure.  Repeat troponin remains negative.  On recheck, she states she feels much better.  She does not have an oxygen requirement.  She is nonambulatory at baseline.  Discussed the results with the patient and her family.  Recommend taking 1 additional dose of 60 mg of Lasix tomorrow and close follow-up with cardiology.  After history, exam, and medical workup I feel the patient has been appropriately medically screened and is safe for discharge home. Pertinent diagnoses were discussed with the patient. Patient was given return precautions.   Merryl Hacker, MD 08/14/17 [phone]

## 2017-08-16 ENCOUNTER — Other Ambulatory Visit (HOSPITAL_COMMUNITY): Payer: Self-pay | Admitting: Internal Medicine

## 2017-08-16 DIAGNOSIS — I509 Heart failure, unspecified: Secondary | ICD-10-CM

## 2017-08-20 ENCOUNTER — Ambulatory Visit (HOSPITAL_COMMUNITY): Payer: Medicare Other

## 2017-09-03 ENCOUNTER — Encounter: Payer: Self-pay | Admitting: Internal Medicine

## 2017-09-23 ENCOUNTER — Ambulatory Visit: Payer: Medicare Other | Admitting: Internal Medicine

## 2017-09-24 ENCOUNTER — Encounter: Payer: Self-pay | Admitting: Internal Medicine

## 2017-11-12 ENCOUNTER — Other Ambulatory Visit: Payer: Self-pay | Admitting: Internal Medicine

## 2017-11-12 DIAGNOSIS — Z1231 Encounter for screening mammogram for malignant neoplasm of breast: Secondary | ICD-10-CM

## 2017-12-13 ENCOUNTER — Ambulatory Visit: Payer: Medicare Other

## 2018-01-07 ENCOUNTER — Ambulatory Visit
Admission: RE | Admit: 2018-01-07 | Discharge: 2018-01-07 | Disposition: A | Discharge status: Home / Self Care | Payer: Medicare Other | Source: Ambulatory Visit | Attending: Internal Medicine | Admitting: Internal Medicine

## 2018-01-07 DIAGNOSIS — Z1231 Encounter for screening mammogram for malignant neoplasm of breast: Secondary | ICD-10-CM

## 2018-03-18 ENCOUNTER — Emergency Department (HOSPITAL_COMMUNITY): Payer: Medicare Other

## 2018-03-18 ENCOUNTER — Encounter (HOSPITAL_COMMUNITY): Payer: Self-pay

## 2018-03-18 ENCOUNTER — Other Ambulatory Visit: Payer: Self-pay

## 2018-03-18 ENCOUNTER — Emergency Department (HOSPITAL_COMMUNITY)
Admission: EM | Admit: 2018-03-18 | Discharge: 2018-03-18 | Disposition: A | Discharge status: Home / Self Care | Payer: Medicare Other | Attending: Emergency Medicine | Admitting: Emergency Medicine

## 2018-03-18 DIAGNOSIS — Z79899 Other long term (current) drug therapy: Secondary | ICD-10-CM | POA: Insufficient documentation

## 2018-03-18 DIAGNOSIS — Z7982 Long term (current) use of aspirin: Secondary | ICD-10-CM | POA: Insufficient documentation

## 2018-03-18 DIAGNOSIS — R05 Cough: Secondary | ICD-10-CM | POA: Insufficient documentation

## 2018-03-18 DIAGNOSIS — J810 Acute pulmonary edema: Secondary | ICD-10-CM | POA: Insufficient documentation

## 2018-03-18 DIAGNOSIS — Z951 Presence of aortocoronary bypass graft: Secondary | ICD-10-CM | POA: Diagnosis not present

## 2018-03-18 DIAGNOSIS — I5042 Chronic combined systolic (congestive) and diastolic (congestive) heart failure: Secondary | ICD-10-CM | POA: Insufficient documentation

## 2018-03-18 DIAGNOSIS — N183 Chronic kidney disease, stage 3 (moderate): Secondary | ICD-10-CM | POA: Insufficient documentation

## 2018-03-18 DIAGNOSIS — E1122 Type 2 diabetes mellitus with diabetic chronic kidney disease: Secondary | ICD-10-CM | POA: Insufficient documentation

## 2018-03-18 DIAGNOSIS — I5023 Acute on chronic systolic (congestive) heart failure: Secondary | ICD-10-CM

## 2018-03-18 DIAGNOSIS — I130 Hypertensive heart and chronic kidney disease with heart failure and stage 1 through stage 4 chronic kidney disease, or unspecified chronic kidney disease: Secondary | ICD-10-CM | POA: Diagnosis not present

## 2018-03-18 DIAGNOSIS — Z87891 Personal history of nicotine dependence: Secondary | ICD-10-CM | POA: Insufficient documentation

## 2018-03-18 DIAGNOSIS — Z8543 Personal history of malignant neoplasm of ovary: Secondary | ICD-10-CM | POA: Diagnosis not present

## 2018-03-18 DIAGNOSIS — R0602 Shortness of breath: Secondary | ICD-10-CM | POA: Diagnosis present

## 2018-03-18 DIAGNOSIS — Z950 Presence of cardiac pacemaker: Secondary | ICD-10-CM | POA: Diagnosis not present

## 2018-03-18 LAB — CBC
HEMATOCRIT: 47.2 % — AB (ref 36.0–46.0)
HEMOGLOBIN: 13.9 g/dL (ref 12.0–15.0)
MCH: 27 pg (ref 26.0–34.0)
MCHC: 29.4 g/dL — ABNORMAL LOW (ref 30.0–36.0)
MCV: 91.8 fL (ref 80.0–100.0)
Platelets: 289 10*3/uL (ref 150–400)
RBC: 5.14 MIL/uL — ABNORMAL HIGH (ref 3.87–5.11)
RDW: 13.4 % (ref 11.5–15.5)
WBC: 13.1 10*3/uL — ABNORMAL HIGH (ref 4.0–10.5)
nRBC: 0 % (ref 0.0–0.2)

## 2018-03-18 LAB — BASIC METABOLIC PANEL
Anion gap: 11 (ref 5–15)
BUN: 9 mg/dL (ref 8–23)
CHLORIDE: 109 mmol/L (ref 98–111)
CO2: 23 mmol/L (ref 22–32)
Calcium: 9.4 mg/dL (ref 8.9–10.3)
Creatinine, Ser: 1.41 mg/dL — ABNORMAL HIGH (ref 0.44–1.00)
GFR calc Af Amer: 42 mL/min — ABNORMAL LOW (ref >60)
GFR calc non Af Amer: 37 mL/min — ABNORMAL LOW (ref >60)
GLUCOSE: 189 mg/dL — AB (ref 70–99)
POTASSIUM: 4.2 mmol/L (ref 3.5–5.1)
Sodium: 143 mmol/L (ref 135–145)

## 2018-03-18 LAB — I-STAT TROPONIN, ED: Troponin i, poc: 0.06 ng/mL (ref 0.00–0.08)

## 2018-03-18 LAB — BRAIN NATRIURETIC PEPTIDE: B Natriuretic Peptide: 1568.9 pg/mL — ABNORMAL HIGH (ref 0.0–100.0)

## 2018-03-18 MED ORDER — IOPAMIDOL (ISOVUE-370) INJECTION 76%
75.0000 mL | Freq: Once | INTRAVENOUS | Status: AC | PRN
  Administered 2018-03-18: 75 mL via INTRAVENOUS

## 2018-03-18 MED ORDER — FUROSEMIDE 20 MG PO TABS: 80.0000 mg | ORAL_TABLET | Freq: Every day | ORAL | 0 refills | Status: AC

## 2018-03-18 MED ORDER — FUROSEMIDE 10 MG/ML IJ SOLN
60.0000 mg | Freq: Once | INTRAMUSCULAR | Status: AC
  Administered 2018-03-18: 60 mg via INTRAVENOUS
  Filled 2018-03-18: qty 6

## 2018-03-18 MED ORDER — IOPAMIDOL (ISOVUE-370) INJECTION 76%
INTRAVENOUS | Status: AC
  Filled 2018-03-18: qty 100

## 2018-03-18 NOTE — ED Notes (Signed)
IV team at bedside 

## 2018-03-18 NOTE — Discharge Instructions (Addendum)
You were seen today for shortness of breath.  This is likely related to your heart failure.  Your Lasix will be increased to 80 mg for the next 4 days.  Follow-up with your primary doctor for repeat blood work and recheck.

## 2018-03-18 NOTE — ED Notes (Signed)
CT made aware of IV placement.

## 2018-03-18 NOTE — ED Provider Notes (Signed)
[facility] EMERGENCY DEPARTMENT Provider Note   CSN: [id number] Arrival date & time: 03/18/18  0206     History   Chief Complaint Chief Complaint  Patient presents with  . Chest Pain  . Shortness of Breath    HPI [name] is a 72 y.o. female.  HPI  This is a 72 year old female with a history of cardiomyopathy, heart failure, chronic kidney disease, DVT, hypertension, hyperlipidemia who presents with shortness of breath.  Patient presents with worsening shortness of breath over the last 24 hours.  Reports some pressure-like chest pain associated.  Has had a cough that has been productive.  Has not noted any lower extremity swelling.  Shortness of breath is worse with laying flat.  Denies any fevers.  She was given 2 duo nebs by EMS with minimal improvement.  Nausea, vomiting, abdominal pain.  Past Medical History:  Diagnosis Date  . Anemia   . Anemia, iron deficiency 06/20/2015  . Breast mass   . Cardiac pacemaker in situ   . CARDIOMYOPATHY 05/23/2006   Annotation: Likely 2/2 EtOH, DM and HTN Qualifier: Diagnosis of  By: Oretha Ellis    . CKD (chronic kidney disease)   . Colon polyp    hyperplastic and adenomatous  . CONGESTIVE HEART FAILURE 02/14/2006   Qualifier: Diagnosis of  By: Oretha Ellis    . DECUBITUS ULCER, BUTTOCK 02/14/2009   Qualifier: Diagnosis of  By: Cathren Laine MD, Ankit    . Depression with anxiety   . Diabetes mellitus, type II ([place])   . Diverticular disease   . DVT 12/31/2008   Qualifier: Diagnosis of  By: Caryn Section MD, Adwait    . Dysphagia   . Edema 06/20/2015  . GERD (gastroesophageal reflux disease)   . Hemiparesis and alteration of sensations as late effects of stroke ([place]) 05/23/2017  . Hemiplegia ([place])   . Hyperlipidemia   . HYPERTENSION 02/14/2006   Qualifier: Diagnosis of  By: Oretha Ellis    . HYPOKALEMIA 11/26/2006   Qualifier: Diagnosis of  By: Hilma Favors  DO, Beth    . Lower extremity edema    chronic    . MI (myocardial infarction) ([place])   . MITRAL REGURGITATION 05/23/2006   Annotation: S/P valve repair Qualifier: Diagnosis of  By: Oretha Ellis    . Neuropathy   . Ovarian cancer ([place])   . PACEMAKER, PERMANENT 05/23/2006   Qualifier: Diagnosis of  By: Oretha Ellis    . PE 12/31/2008   Qualifier: Diagnosis of  By: Elie Confer PharmD, Ulice Dash    . PERIPHERAL NEUROPATHY 11/18/2006   Qualifier: Diagnosis of  By: Hilma Favors  DO, Beth    . RENAL INSUFFICIENCY 05/23/2006   Qualifier: Diagnosis of  By: Oretha Ellis    . Stroke [facility]) 2010   Resultant hemiplegia & dysphagia  . Type II or unspecified type diabetes mellitus with neurological manifestations, not stated as uncontrolled(250.60) 02/23/2013    Patient Active Problem List   Diagnosis Date Noted  . Hemiparesis and alteration of sensations as late effects of stroke ([place]) 05/23/2017  . Nausea without vomiting 10/10/2015  . Depression, major 10/10/2015  . Edema 06/20/2015  . Osteoarthrosis, unspecified whether generalized or localized, involving lower leg 10/19/2013  . DM (diabetes mellitus), type 2 with neurological complications ([place]) 68/03/5725  . Unspecified vitamin D deficiency 08/15/2012  . GERD (gastroesophageal reflux disease) 08/15/2012  . PE 12/31/2008  . DVT 12/31/2008  . UTI 12/31/2008  . CVA WITH  LEFT HEMIPARESIS 11/12/2008  . PERIPHERAL NEUROPATHY 11/18/2006  . Dyslipidemia 05/23/2006  . Anxiety state 05/23/2006  . MITRAL REGURGITATION 05/23/2006  . CARDIOMYOPATHY 05/23/2006  . CONSTIPATION NOS 05/23/2006  . Type 2 DM with CKD stage 3 and hypertension ([place]) 05/23/2006  . PACEMAKER, PERMANENT 05/23/2006  . Essential hypertension 02/14/2006  . Chronic diastolic congestive heart failure ([place]) 02/14/2006    Past Surgical History:  Procedure Laterality Date  . CATARACT EXTRACTION Bilateral   . CORONARY ARTERY BYPASS GRAFT    . PACEMAKER INSERTION       OB History   None      Home Medications     Prior to Admission medications   Medication Sig Start Date End Date Taking? Authorizing Provider  acetaminophen (TYLENOL) 325 MG tablet Take 650 mg by mouth daily. @ 1300    [provider]  aspirin EC 81 MG tablet Take 81 mg by mouth daily.    [provider]  atorvastatin (LIPITOR) 40 MG tablet Take 40 mg by mouth daily at 6 PM.     [provider]  benzonatate (TESSALON) 100 MG capsule Take 100 mg by mouth 3 (three) times daily as needed for cough.    [provider]  carvedilol (COREG) 25 MG tablet Take 25 mg by mouth 2 (two) times daily with a meal. For CAD    [provider]  Cholecalciferol (VITAMIN D3 PO) Take 1,000 Units by mouth daily.    [provider]  Esomeprazole Magnesium 20 MG TBEC Take 20 mg by mouth daily at 12 noon. For GERD    [provider]  furosemide (LASIX) 20 MG tablet Take 4 tablets (80 mg total) by mouth daily. 03/18/18   Zayanna Pundt, Barbette Hair, MD  gabapentin (NEURONTIN) 100 MG capsule Take 100 mg by mouth at bedtime.    [provider]  insulin glargine (LANTUS) 100 UNIT/ML injection Inject 15 Units into the skin at bedtime. DO NOT MIX WITH OTHER INSULINS    [provider]  ipratropium-albuterol (DUONEB) 0.5-2.5 (3) MG/3ML SOLN Take 3 mLs by nebulization every 6 (six) hours as needed.    [provider]  losartan (COZAAR) 50 MG tablet Take 50 mg by mouth daily. For Hypertension    [provider]  memantine (NAMENDA) 5 MG tablet Take 5 mg by mouth daily. 08/02/17   [provider]  Multiple Vitamins-Minerals (CENTRUM ADULTS PO) Take 1 tablet by mouth daily.    [provider]  [facility] powder Apply 100,000 Units topically 2 (two) times daily. To affected area groin, under right arm folds and breast 05/27/17   [provider]  Polyethyl Glycol-Propyl Glycol (SYSTANE ULTRA OP) Apply 1 drop to eye 3 (three) times daily. Both , wait 3-5 minutes between  eye medications    [provider]  terbinafine (LAMISIL) 250 MG tablet Take 250 mg by mouth daily. 06/04/17   [provider]    Family History Family History  Problem Relation Age of Onset  . Dementia Mother   . Heart attack Father   . Breast cancer Neg Hx     Social History Social History   Tobacco Use  . Smoking status: Former Research scientist (life sciences)  . Smokeless tobacco: Never Used  Substance Use Topics  . Alcohol use: No    Alcohol/week: 0.0 standard drinks  . Drug use: No     Allergies   Lotensin [benazepril hcl]   Review of Systems Review of Systems  Constitutional: Negative for  fever.  Respiratory: Positive for cough and shortness of breath.   Cardiovascular: Positive for chest pain and leg swelling.  Gastrointestinal: Negative for abdominal pain, nausea and vomiting.  Genitourinary: Negative for dysuria.  All other systems reviewed and are negative.    Physical Exam Updated Vital Signs BP (!) 107/53   Pulse (!) 117   Temp 100.1 F (37.8 C) (Rectal)   Resp (!) 29   Wt 95.7 kg   SpO2 99%   BMI 34.06 kg/m   Physical Exam  Constitutional: She is oriented to person, place, and time.  Chronically ill-appearing but nontoxic, no acute distress  HENT:  Head: Normocephalic and atraumatic.  Eyes: Pupils are equal, round, and reactive to light.  Neck: Neck supple.  Cardiovascular: Regular rhythm and normal heart sounds.  Tachycardia  Pulmonary/Chest: Effort normal. No respiratory distress. She has wheezes.  Fair air movement, wheezing mostly in the lower lobes.  Patient began full sentences.  Abdominal: Soft. Bowel sounds are normal.  Musculoskeletal:       Right lower leg: She exhibits edema.       Left lower leg: She exhibits edema.  Left greater than right lower extremity edema, no calf tenderness  Neurological: She is alert and oriented to person, place, and time.  Skin: Skin is warm and dry.  Psychiatric: She has a normal mood and affect.   Nursing note and vitals reviewed.    ED Treatments / Results  Labs (all labs ordered are listed, but only abnormal results are displayed) Labs Reviewed  BASIC METABOLIC PANEL - Abnormal; Notable for the following components:      Result Value   Glucose, Bld 189 (*)    Creatinine, Ser 1.41 (*)    GFR calc non Af Amer 37 (*)    GFR calc Af Amer 42 (*)    All other components within normal limits  CBC - Abnormal; Notable for the following components:   WBC 13.1 (*)    RBC 5.14 (*)    HCT 47.2 (*)    MCHC 29.4 (*)    All other components within normal limits  BRAIN NATRIURETIC PEPTIDE - Abnormal; Notable for the following components:   B Natriuretic Peptide 1,568.9 (*)    All other components within normal limits  I-STAT TROPONIN, ED    EKG EKG Interpretation  Date/Time:  Tuesday March 18 2018 02:14:21 EST Ventricular Rate:  104 PR Interval:    QRS Duration: 152 QT Interval:  406 QTC Calculation: 535 R Axis:   152 Text Interpretation:  Sinus tachycardia Consider left ventricular hypertrophy Prolonged QT interval Faster than prior Otherwise no significant change Confirmed by Thayer Jew [phone]) on 03/18/2018 2:17:52 AM   Radiology Dg Chest 2 View  Result Date: 03/18/2018 CLINICAL DATA:  Acute onset of worsening shortness of breath and generalized chest pain. EXAM: CHEST - 2 VIEW COMPARISON:  Chest radiograph performed 08/13/2017, and CTA of the chest performed 08/14/2017 FINDINGS: The lungs are well-aerated. Small bilateral pleural effusions are suggested on the lateral view. Vascular congestion is seen. Increased interstitial markings raise concern for mild pulmonary edema. There is no evidence of pneumothorax. The heart is mildly enlarged. The patient is status post median sternotomy. A valve replacement is noted. No acute osseous abnormalities are seen. IMPRESSION: Small bilateral pleural effusions suggested on the lateral view. Vascular congestion and mild  cardiomegaly. Increased interstitial markings raise concern for mild pulmonary edema. Electronically Signed   By: Garald Balding M.D.   On: 03/18/2018  02:50   Ct Angio Chest Pe W And/or Wo Contrast  Result Date: 03/18/2018 CLINICAL DATA:  Acute onset of worsening shortness of breath and generalized chest pain. EXAM: CT ANGIOGRAPHY CHEST WITH CONTRAST TECHNIQUE: Multidetector CT imaging of the chest was performed using the standard protocol during bolus administration of intravenous contrast. Multiplanar CT image reconstructions and MIPs were obtained to evaluate the vascular anatomy. CONTRAST:  51mL ISOVUE-370 IOPAMIDOL (ISOVUE-370) INJECTION 76% COMPARISON:  Chest radiograph today at 2:37 a.m., and CTA of the chest performed 08/14/2017 FINDINGS: Cardiovascular:  There is no evidence of pulmonary embolus. The heart is borderline enlarged. The patient is status post median sternotomy. A mitral valve replacement is noted. Scattered coronary artery calcifications are seen. Scattered calcification is noted along the thoracic aorta and proximal great vessels. Mediastinum/Nodes: No pericardial effusion is identified. No mediastinal lymphadenopathy is seen. There is mild bronchomalacia bilaterally. There appears to be a 3.7 cm hypodensity at the left thyroid lobe. No axillary lymphadenopathy is seen. Lungs/Pleura: Small bilateral pleural effusions are noted, with associated atelectasis. Increased interstitial markings may reflect mild pulmonary edema. There is no evidence of pneumothorax. No masses are identified. Upper Abdomen: The visualized portions of the liver and spleen are unremarkable. The visualized portions of the adrenal glands are within normal limits. Calcification is noted at the proximal abdominal aorta and its branches. Musculoskeletal: No acute osseous abnormalities are identified. The visualized musculature is unremarkable in appearance. Review of the MIP images confirms the above findings.  IMPRESSION: 1. No evidence of pulmonary embolus. 2. Small bilateral pleural effusions, with associated atelectasis. Increased interstitial markings may reflect mild pulmonary edema. 3. Mild bronchomalacia bilaterally. 4. Scattered coronary artery calcifications. 5. 3.7 cm hypodensity at the left thyroid lobe. Recommend further evaluation with thyroid ultrasound. If patient is clinically hyperthyroid, consider nuclear medicine thyroid uptake and scan. Aortic Atherosclerosis (ICD10-I70.0). Electronically Signed   By: Garald Balding M.D.   On: 03/18/2018 05:10    Procedures Procedures (including critical care time)  Medications Ordered in ED Medications  furosemide (LASIX) injection 60 mg (60 mg Intravenous Given 03/18/18 0320)  iopamidol (ISOVUE-370) 76 % injection 75 mL (75 mLs Intravenous Contrast Given 03/18/18 0451)     Initial Impression / Assessment and Plan / ED Course  I have reviewed the triage vital signs and the nursing notes.  Pertinent labs & imaging results that were available during my care of the patient were reviewed by me and considered in my medical decision making (see chart for details).  Clinical Course as of Mar 18 554  Tue Mar 18, 2018  0309 X-ray suggestive possibly of mild pulmonary edema.  Patient does have leukocytosis to 13.1.  She also has asymmetric swelling of the lower extremities.  For this reason, would like to further evaluate for pneumonia and/or blood clot.  Feel CT scan would give a clearer picture of the etiology.   [CH]    Clinical Course User Index [CH] Dunia Pringle, Barbette Hair, MD    Patient presents with shortness of breath.  She is nontoxic on exam.  Vital signs notable for mild tachycardia.  She has some wheezing on exam and some lower extremity edema that is asymmetric.  Initial O2 sats 92% but she did drop to 88%.  She is in no respiratory distress.  Initial work-up most suggestive of heart failure.  However, given asymmetric swelling and mild  leukocytosis with a temperature of 100.1, will obtain CT scan to further differentiate for additional pneumonia or PE.  CT  scan confirms likely pulmonary edema without evidence of PE or consolidation.  On recheck, patient is resting comfortably.  Heart rate is 104.  Rectal temp 100.1.  She does not appear septic or infected.  Suspect heart failure.  She was given 60 mg of IV Lasix.  Her daughter is at the bedside.  Patient is not very active or ambulatory at baseline.  Oxygen was removed and she was able to maintain her pulse ox 92 to 94% without supplemental oxygen.  Plan discussed with the patient and her family.  They are comfortable with increasing Lasix for the next 3 to 4 days with primary care follow-up.  If she has any new or worsening symptoms she should be reevaluated.  After history, exam, and medical workup I feel the patient has been appropriately medically screened and is safe for discharge home. Pertinent diagnoses were discussed with the patient. Patient was given return precautions.   Final Clinical Impressions(s) / ED Diagnoses   Final diagnoses:  Acute on chronic systolic heart failure ([place])  Acute pulmonary edema [facility])    ED Discharge Orders         Ordered    furosemide (LASIX) 20 MG tablet  Daily     03/18/18 0554           Merryl Hacker, MD 03/18/18 [phone]

## 2018-03-18 NOTE — ED Triage Notes (Signed)
Pt BIB [facility] for eval of worsening SOB/CP from [place]. Per EMS pt has been c/o SOB/CP for the past day or so, got acutely worse over last hour prompting call to EMS. Pt received 2 nebs (1 albuterol, 1 duoneb) from EMS. Pt is speaking in 2-3 word sentences on arrival, ems reports this is the most she's been able to speak since their arrival.

## 2019-03-03 ENCOUNTER — Other Ambulatory Visit: Payer: Self-pay | Admitting: Internal Medicine

## 2019-03-03 DIAGNOSIS — Z1231 Encounter for screening mammogram for malignant neoplasm of breast: Secondary | ICD-10-CM

## 2019-04-22 ENCOUNTER — Ambulatory Visit: Payer: Medicare Other

## 2019-06-02 ENCOUNTER — Ambulatory Visit: Payer: Medicare Other | Admitting: Internal Medicine
